# Patient Record
Sex: Female | Born: 2000 | Race: White | Hispanic: No | Smoking: Never smoker
Health system: Southern US, Community
[De-identification: ages and names within clinical notes are randomized; demographics above are authoritative.]

## PROBLEM LIST (undated history)

## (undated) ENCOUNTER — Inpatient Hospital Stay: Payer: Self-pay

## (undated) DIAGNOSIS — R569 Unspecified convulsions: Secondary | ICD-10-CM

## (undated) DIAGNOSIS — J45909 Unspecified asthma, uncomplicated: Secondary | ICD-10-CM

## (undated) HISTORY — PX: TONSILLECTOMY: SUR1361

## (undated) HISTORY — PX: ADENOIDECTOMY: SUR15

---
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
## 2020-10-15 ENCOUNTER — Ambulatory Visit
Admission: RE | Admit: 2020-10-15 | Discharge: 2020-10-15 | Disposition: A | Discharge status: Home / Self Care | Payer: Medicaid Other | Attending: Medical Oncology | Admitting: Medical Oncology

## 2020-10-15 ENCOUNTER — Other Ambulatory Visit: Payer: Self-pay | Admitting: Medical Oncology

## 2020-10-15 ENCOUNTER — Ambulatory Visit
Admission: RE | Admit: 2020-10-15 | Discharge: 2020-10-15 | Disposition: A | Discharge status: Home / Self Care | Payer: Medicaid Other | Source: Ambulatory Visit | Attending: Medical Oncology | Admitting: Medical Oncology

## 2020-10-15 ENCOUNTER — Other Ambulatory Visit: Payer: Self-pay

## 2020-10-15 DIAGNOSIS — G5701 Lesion of sciatic nerve, right lower limb: Secondary | ICD-10-CM | POA: Diagnosis present

## 2021-01-06 ENCOUNTER — Emergency Department
Admission: EM | Admit: 2021-01-06 | Discharge: 2021-01-06 | Disposition: A | Discharge status: Home / Self Care | Payer: Medicaid Other | Attending: Emergency Medicine | Admitting: Emergency Medicine

## 2021-01-06 ENCOUNTER — Other Ambulatory Visit: Payer: Self-pay

## 2021-01-06 ENCOUNTER — Emergency Department: Payer: Medicaid Other

## 2021-01-06 DIAGNOSIS — R441 Visual hallucinations: Secondary | ICD-10-CM

## 2021-01-06 DIAGNOSIS — F410 Panic disorder [episodic paroxysmal anxiety] without agoraphobia: Secondary | ICD-10-CM

## 2021-01-06 DIAGNOSIS — R45851 Suicidal ideations: Secondary | ICD-10-CM | POA: Insufficient documentation

## 2021-01-06 DIAGNOSIS — F419 Anxiety disorder, unspecified: Secondary | ICD-10-CM | POA: Diagnosis not present

## 2021-01-06 DIAGNOSIS — J45909 Unspecified asthma, uncomplicated: Secondary | ICD-10-CM | POA: Diagnosis not present

## 2021-01-06 DIAGNOSIS — Z8669 Personal history of other diseases of the nervous system and sense organs: Secondary | ICD-10-CM

## 2021-01-06 DIAGNOSIS — Z7722 Contact with and (suspected) exposure to environmental tobacco smoke (acute) (chronic): Secondary | ICD-10-CM | POA: Diagnosis not present

## 2021-01-06 DIAGNOSIS — R519 Headache, unspecified: Secondary | ICD-10-CM

## 2021-01-06 DIAGNOSIS — R443 Hallucinations, unspecified: Secondary | ICD-10-CM

## 2021-01-06 DIAGNOSIS — G8929 Other chronic pain: Secondary | ICD-10-CM

## 2021-01-06 HISTORY — DX: Unspecified convulsions: R56.9

## 2021-01-06 LAB — URINE DRUG SCREEN, QUALITATIVE (ARMC ONLY)
Amphetamines, Ur Screen: NOT DETECTED
Barbiturates, Ur Screen: NOT DETECTED
Benzodiazepine, Ur Scrn: NOT DETECTED
Cannabinoid 50 Ng, Ur ~~LOC~~: NOT DETECTED
Cocaine Metabolite,Ur ~~LOC~~: NOT DETECTED
MDMA (Ecstasy)Ur Screen: NOT DETECTED
Methadone Scn, Ur: NOT DETECTED
Opiate, Ur Screen: NOT DETECTED
Phencyclidine (PCP) Ur S: NOT DETECTED
Tricyclic, Ur Screen: NOT DETECTED

## 2021-01-06 LAB — COMPREHENSIVE METABOLIC PANEL
ALT: 17 U/L (ref 0–44)
AST: 18 U/L (ref 15–41)
Albumin: 4.6 g/dL (ref 3.5–5.0)
Alkaline Phosphatase: 54 U/L (ref 38–126)
Anion gap: 9 (ref 5–15)
BUN: 11 mg/dL (ref 6–20)
CO2: 21 mmol/L — ABNORMAL LOW (ref 22–32)
Calcium: 9.7 mg/dL (ref 8.9–10.3)
Chloride: 109 mmol/L (ref 98–111)
Creatinine, Ser: 0.78 mg/dL (ref 0.44–1.00)
GFR, Estimated: >60 mL/min (ref >60)
Glucose, Bld: 111 mg/dL — ABNORMAL HIGH (ref 70–99)
Potassium: 3.7 mmol/L (ref 3.5–5.1)
Sodium: 139 mmol/L (ref 135–145)
Total Bilirubin: 0.7 mg/dL (ref 0.3–1.2)
Total Protein: 7.8 g/dL (ref 6.5–8.1)

## 2021-01-06 LAB — CBC
HCT: 41.6 % (ref 36.0–46.0)
Hemoglobin: 13.9 g/dL (ref 12.0–15.0)
MCH: 29.6 pg (ref 26.0–34.0)
MCHC: 33.4 g/dL (ref 30.0–36.0)
MCV: 88.5 fL (ref 80.0–100.0)
Platelets: 321 10*3/uL (ref 150–400)
RBC: 4.7 MIL/uL (ref 3.87–5.11)
RDW: 12.8 % (ref 11.5–15.5)
WBC: 7.7 10*3/uL (ref 4.0–10.5)
nRBC: 0 % (ref 0.0–0.2)

## 2021-01-06 LAB — SALICYLATE LEVEL: Salicylate Lvl: <7 mg/dL — ABNORMAL LOW (ref 7.0–30.0)

## 2021-01-06 LAB — POC URINE PREG, ED: Preg Test, Ur: NEGATIVE

## 2021-01-06 LAB — ETHANOL: Alcohol, Ethyl (B): <10 mg/dL (ref <10)

## 2021-01-06 LAB — TSH: TSH: 0.561 u[IU]/mL (ref 0.350–4.500)

## 2021-01-06 LAB — ACETAMINOPHEN LEVEL: Acetaminophen (Tylenol), Serum: <10 ug/mL — ABNORMAL LOW (ref 10–30)

## 2021-01-06 NOTE — ED Notes (Signed)
TTS at bedside. 

## 2021-01-06 NOTE — ED Notes (Signed)
Pt transported to CT ?

## 2021-01-06 NOTE — ED Provider Notes (Signed)
[facility] Emergency Department Provider Note   ____________________________________________   Event Date/Time   First MD Initiated Contact with Patient 01/06/21 1553     (approximate)  I have reviewed the triage vital signs and the nursing notes.   HISTORY  Chief Complaint Hallucinations    HPI Allison [name] is a 20 y.o. female with past medical history of asthma, migraines, and seizures who presents to the ED for hallucinations.  Patient reports that for the past couple of weeks she has been seeing a dark figure off in the distance.  She states that no one else sees this figure and that it is very upsetting for her.  It makes her feel anxious and like she is going to have a panic attack, but it does not typically come on because she is feeling anxious.  She denies any auditory hallucinations and does not have any thoughts of harming herself or others.  She denies any alcohol or drug abuse.  She does report a history of anxiety and seizures in the past, was previously on medications for these but was stopped about a year ago for reasons that patient is unsure of.  She states she recently scheduled an appointment with her neurologist for March 1 to discuss restarting her medications.  She denies any fevers, cough, headache, chest pain, shortness of breath, dysuria, or hematuria.        Past Medical History:  Diagnosis Date  . Asthma   . Migraine   . Seizures [facility])     Patient Active Problem List   Diagnosis Date Noted  . Visual hallucination 01/06/2021  . Chronic headaches 01/06/2021  . 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 Panic attacks 01/06/2021    Past Surgical History:  Procedure Laterality Date  . ADENOIDECTOMY    . TONSILLECTOMY      Prior to Admission medications   Medication Sig Start Date End Date Taking? Authorizing Provider  acetaminophen-codeine (TYLENOL #3) 300-30 MG per tablet Take 1 tablet by mouth every 4 (four) hours as  needed. For pain.    [provider]  cephALEXin (KEFLEX) 500 MG capsule Take 1 capsule (500 mg total) by mouth 3 (three) times daily. 11/23/17   Minna Antis, MD  ondansetron (ZOFRAN ODT) 4 MG disintegrating tablet Take 1 tablet (4 mg total) by mouth every 8 (eight) hours as needed for nausea or vomiting. 11/23/17   Minna Antis, MD  polyethylene glycol powder (GLYCOLAX/MIRALAX) powder Take 17 g by mouth daily. 11/01/14   Saverio Danker, MD  triamcinolone cream (KENALOG) 0.1 % Apply 1 application topically 2 (two) times daily as needed. For eczema    [provider]    Allergies Lactose intolerance (gi)  History reviewed. No pertinent family history.  Social History Social History   Tobacco Use  . Smoking status: Passive Smoke Exposure - Never Smoker  . Smokeless tobacco: Never Used  Substance Use Topics  . Alcohol use: No  . Drug use: Never    Review of Systems  Constitutional: No fever/chills Eyes: No visual changes. ENT: No sore throat. Cardiovascular: Denies chest pain. Respiratory: Denies shortness of breath. Gastrointestinal: No abdominal pain.  No nausea, no vomiting.  No diarrhea.  No constipation. Genitourinary: Negative for dysuria. Musculoskeletal: Negative for back pain. Skin: Negative for rash. Neurological: Negative for headaches, focal weakness or numbness.  Positive for hallucinations.  ____________________________________________   PHYSICAL EXAM:  VITAL SIGNS: ED Triage Vitals  Enc Vitals Group  BP 01/06/21 1528 130/74     Pulse Rate 01/06/21 1528 98     Resp 01/06/21 1528 18     Temp 01/06/21 1528 98 F (36.7 C)     Temp src --      SpO2 01/06/21 1528 100 %     Weight 01/06/21 1529 150 lb (68 kg)     Height 01/06/21 1529 5' (1.524 m)     Head Circumference --      Peak Flow --      Pain Score 01/06/21 1528 4     Pain Loc --      Pain Edu? --      Excl. in GC? --     Constitutional: Alert and  oriented. Eyes: Conjunctivae are normal. Head: Atraumatic. Nose: No congestion/rhinnorhea. Mouth/Throat: Mucous membranes are moist. Neck: Normal ROM Cardiovascular: Normal rate, regular rhythm. Grossly normal heart sounds. Respiratory: Normal respiratory effort.  No retractions. Lungs CTAB. Gastrointestinal: Soft and nontender. No distention. Genitourinary: deferred Musculoskeletal: No lower extremity tenderness nor edema. Neurologic:  Normal speech and language. No gross focal neurologic deficits are appreciated. Skin:  Skin is warm, dry and intact. No rash noted. Psychiatric: Mood and affect are normal. Speech and behavior are normal.  ____________________________________________   LABS (all labs ordered are listed, but only abnormal results are displayed)  Labs Reviewed  COMPREHENSIVE METABOLIC PANEL - Abnormal; Notable for the following components:      Result Value   CO2 21 (*)    Glucose, Bld 111 (*)    All other components within normal limits  SALICYLATE LEVEL - Abnormal; Notable for the following components:   Salicylate Lvl <7.0 (*)    All other components within normal limits  ACETAMINOPHEN LEVEL - Abnormal; Notable for the following components:   Acetaminophen (Tylenol), Serum <10 (*)    All other components within normal limits  ETHANOL  CBC  URINE DRUG SCREEN, QUALITATIVE ([facility] ONLY)  TSH  POC URINE PREG, ED    PROCEDURES  Procedure(s) performed (including Critical Care):  Procedures   ____________________________________________   INITIAL IMPRESSION / ASSESSMENT AND PLAN / ED COURSE       20 year old female with past medical history of migraine, seizures, and asthma who presents to the ED for hallucinations of a dark figure increasing over the past couple of weeks.  Patient is very lucid at this time, does not appear psychotic and denies any thoughts of harming herself or others.  She was evaluated by psychiatry, who does not feel patient  represents any acute threat to herself or others.  There is no indication for psychiatric admission at this time.  Screening labs are unremarkable and we will add on TSH as well as CT head at the recommendation of psychiatry.  If remainder of work-up is unremarkable, patient would be appropriate for discharge home with neurology follow-up.  CT head reviewed by me, no obvious hemorrhage and negative for acute process per radiology.  TSH is within normal limits.  Patient is appropriate for discharge home with neurology and psychiatry follow-up, was counseled to return to the ED for new worsening symptoms.  Patient agrees with plan.      ____________________________________________   FINAL CLINICAL IMPRESSION(S) / ED DIAGNOSES  Final diagnoses:  Hallucinations     ED Discharge Orders    None       Note:  This document was prepared using Dragon voice recognition software and may include unintentional dictation errors.   Chesley Noon, MD 01/06/21  1805  
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
## 2021-01-06 NOTE — ED Notes (Signed)
Psychiatrist at bedside

## 2021-01-06 NOTE — ED Triage Notes (Signed)
Pt comes via POV from home with c/o hallucinations. Pt states this started a couple weeks ago. Pt states it has been causing her anxiety and her behavior.  Pt states it is a dark figure. Pt denies any voices. Pt denies any SI or HI.  Pt states she just told her neurologist today and they advised her to come here.  Pt denies any new medication. Pt denies any drugs or alcohol.

## 2021-01-06 NOTE — ED Notes (Signed)
VOL, psych consult complete, pend dispo

## 2021-01-06 NOTE — ED Notes (Signed)
Pt alert and oriented X 4, stable for discharge. RR even and unlabored, color WNL. Discussed discharge instructions and follow-up as directed. Discharge medications discussed if prescribed. Pt had opportunity to ask questions, and RN to provide patient/family eduction.  

## 2021-01-06 NOTE — ED Notes (Signed)
Lab called to add on TSH to previous lab work

## 2021-04-28 ENCOUNTER — Other Ambulatory Visit: Payer: Self-pay

## 2021-04-28 ENCOUNTER — Emergency Department
Admission: EM | Admit: 2021-04-28 | Discharge: 2021-04-28 | Disposition: A | Discharge status: Home / Self Care | Payer: No Typology Code available for payment source | Attending: Emergency Medicine | Admitting: Emergency Medicine

## 2021-04-28 ENCOUNTER — Emergency Department: Payer: No Typology Code available for payment source

## 2021-04-28 DIAGNOSIS — S39012A Strain of muscle, fascia and tendon of lower back, initial encounter: Secondary | ICD-10-CM | POA: Insufficient documentation

## 2021-04-28 DIAGNOSIS — Z7722 Contact with and (suspected) exposure to environmental tobacco smoke (acute) (chronic): Secondary | ICD-10-CM | POA: Diagnosis not present

## 2021-04-28 DIAGNOSIS — S161XXA Strain of muscle, fascia and tendon at neck level, initial encounter: Secondary | ICD-10-CM | POA: Insufficient documentation

## 2021-04-28 DIAGNOSIS — R519 Headache, unspecified: Secondary | ICD-10-CM | POA: Insufficient documentation

## 2021-04-28 DIAGNOSIS — Y92410 Unspecified street and highway as the place of occurrence of the external cause: Secondary | ICD-10-CM | POA: Diagnosis not present

## 2021-04-28 DIAGNOSIS — S199XXA Unspecified injury of neck, initial encounter: Secondary | ICD-10-CM | POA: Diagnosis present

## 2021-04-28 DIAGNOSIS — J45909 Unspecified asthma, uncomplicated: Secondary | ICD-10-CM | POA: Insufficient documentation

## 2021-04-28 LAB — POC URINE PREG, ED: Preg Test, Ur: NEGATIVE

## 2021-04-28 MED ORDER — METHOCARBAMOL 500 MG PO TABS: 500.0000 mg | ORAL_TABLET | Freq: Four times a day (QID) | ORAL | 0 refills | Status: AC

## 2021-04-28 MED ORDER — MELOXICAM 15 MG PO TABS: 15.0000 mg | ORAL_TABLET | Freq: Every day | ORAL | 0 refills | Status: AC

## 2021-04-28 MED ORDER — ACETAMINOPHEN 325 MG PO TABS
650.0000 mg | ORAL_TABLET | Freq: Once | ORAL | Status: AC
  Administered 2021-04-28: 650 mg via ORAL
  Filled 2021-04-28: qty 2

## 2021-04-28 MED ORDER — METHOCARBAMOL 500 MG PO TABS
750.0000 mg | ORAL_TABLET | Freq: Once | ORAL | Status: AC
  Administered 2021-04-28: 750 mg via ORAL
  Filled 2021-04-28: qty 2
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
## 2021-04-28 NOTE — Discharge Instructions (Addendum)
Please take medications as prescribed. You may also take Tylenol, up to 1000mg  4x daily as needed for pain. Return to the ER with any worsening, otherwise follow up with primary care.

## 2021-05-14 ENCOUNTER — Ambulatory Visit (LOCAL_COMMUNITY_HEALTH_CENTER): Payer: Medicaid Other

## 2021-05-14 ENCOUNTER — Other Ambulatory Visit: Payer: Self-pay

## 2021-05-14 VITALS — BP 101/67 | Ht 59.0 in | Wt 177.0 lb

## 2021-05-14 DIAGNOSIS — Z3202 Encounter for pregnancy test, result negative: Secondary | ICD-10-CM

## 2021-05-14 NOTE — Progress Notes (Signed)
Pt states she is not trying to get pregnant but if it happens she and her partner are okay with it; they are not trying to prevent pregnancy; she would like to become pregnant. Pt declines all forms of birth control and is currently taking PNV. Pt states she has had several positive home UPT and that she will continue to proceed with PNV and healthy lifestyle as if she is pregnant. Pt states she "was taken off some medicines" because she is pregnant; pt counseled to discuss today's results and any recommended medications with her providers; pt confirms that she will contact providers.

## 2021-05-27 LAB — PREGNANCY, URINE: Preg Test, Ur: NEGATIVE

## 2021-05-27 NOTE — Addendum Note (Signed)
Addended by: Tracey Harries on: 05/27/2021 02:42 PM   Modules accepted: Orders

## 2021-06-05 DIAGNOSIS — Z3401 Encounter for supervision of normal first pregnancy, first trimester: Secondary | ICD-10-CM | POA: Insufficient documentation

## 2021-06-28 ENCOUNTER — Emergency Department
Admission: EM | Admit: 2021-06-28 | Discharge: 2021-06-28 | Disposition: A | Discharge status: Home / Self Care | Payer: Medicaid Other | Attending: Emergency Medicine | Admitting: Emergency Medicine

## 2021-06-28 ENCOUNTER — Emergency Department: Payer: Medicaid Other

## 2021-06-28 ENCOUNTER — Other Ambulatory Visit: Payer: Self-pay

## 2021-06-28 DIAGNOSIS — O039 Complete or unspecified spontaneous abortion without complication: Secondary | ICD-10-CM | POA: Diagnosis not present

## 2021-06-28 DIAGNOSIS — Z7722 Contact with and (suspected) exposure to environmental tobacco smoke (acute) (chronic): Secondary | ICD-10-CM | POA: Diagnosis not present

## 2021-06-28 DIAGNOSIS — J45909 Unspecified asthma, uncomplicated: Secondary | ICD-10-CM | POA: Insufficient documentation

## 2021-06-28 DIAGNOSIS — O4690 Antepartum hemorrhage, unspecified, unspecified trimester: Secondary | ICD-10-CM

## 2021-06-28 DIAGNOSIS — M545 Low back pain, unspecified: Secondary | ICD-10-CM | POA: Insufficient documentation

## 2021-06-28 DIAGNOSIS — N939 Abnormal uterine and vaginal bleeding, unspecified: Secondary | ICD-10-CM | POA: Diagnosis not present

## 2021-06-28 DIAGNOSIS — Z3A10 10 weeks gestation of pregnancy: Secondary | ICD-10-CM | POA: Diagnosis not present

## 2021-06-28 LAB — BASIC METABOLIC PANEL
Anion gap: 9 (ref 5–15)
BUN: 8 mg/dL (ref 6–20)
CO2: 20 mmol/L — ABNORMAL LOW (ref 22–32)
Calcium: 9.4 mg/dL (ref 8.9–10.3)
Chloride: 105 mmol/L (ref 98–111)
Creatinine, Ser: 0.62 mg/dL (ref 0.44–1.00)
GFR, Estimated: >60 mL/min (ref >60)
Glucose, Bld: 78 mg/dL (ref 70–99)
Potassium: 4 mmol/L (ref 3.5–5.1)
Sodium: 134 mmol/L — ABNORMAL LOW (ref 135–145)

## 2021-06-28 LAB — CBC
HCT: 42.6 % (ref 36.0–46.0)
Hemoglobin: 14.8 g/dL (ref 12.0–15.0)
MCH: 30.3 pg (ref 26.0–34.0)
MCHC: 34.7 g/dL (ref 30.0–36.0)
MCV: 87.1 fL (ref 80.0–100.0)
Platelets: 290 10*3/uL (ref 150–400)
RBC: 4.89 MIL/uL (ref 3.87–5.11)
RDW: 12.4 % (ref 11.5–15.5)
WBC: 11.2 10*3/uL — ABNORMAL HIGH (ref 4.0–10.5)
nRBC: 0 % (ref 0.0–0.2)

## 2021-06-28 LAB — ABO/RH: ABO/RH(D): O POS

## 2021-06-28 LAB — HCG, QUANTITATIVE, PREGNANCY: hCG, Beta Chain, Quant, S: 190323 m[IU]/mL — ABNORMAL HIGH (ref <5)

## 2021-06-28 MED ORDER — SODIUM CHLORIDE 0.9 % IV BOLUS
1000.0000 mL | Freq: Once | INTRAVENOUS | Status: AC
  Administered 2021-06-28: 1000 mL via INTRAVENOUS

## 2021-06-28 MED ORDER — HYDROCODONE-ACETAMINOPHEN 5-325 MG PO TABS: 1.0000 | ORAL_TABLET | Freq: Four times a day (QID) | ORAL | 0 refills | Status: DC | PRN

## 2021-06-28 MED ORDER — HYDROCODONE-ACETAMINOPHEN 5-325 MG PO TABS: 1.0000 | ORAL_TABLET | Freq: Four times a day (QID) | ORAL | 0 refills | Status: AC | PRN

## 2021-06-28 MED ORDER — ACETAMINOPHEN 325 MG PO TABS
650.0000 mg | ORAL_TABLET | Freq: Once | ORAL | Status: AC
  Administered 2021-06-28: 650 mg via ORAL
  Filled 2021-06-28: qty 2

## 2021-06-28 MED ORDER — ONDANSETRON HCL 4 MG/2ML IJ SOLN
4.0000 mg | Freq: Once | INTRAMUSCULAR | Status: AC
  Administered 2021-06-28: 4 mg via INTRAVENOUS
  Filled 2021-06-28: qty 2

## 2021-06-28 NOTE — ED Triage Notes (Signed)
Pt comes pov with vaginal bleeding and cramping starting today. Pt is [redacted] weeks pregnant. First pregnancy.

## 2021-06-28 NOTE — ED Provider Notes (Signed)
[facility]-EMERGENCY DEPARTMENT  ____________________________________________  Time seen: Approximately 5:17 PM  I have reviewed the triage vital signs and the nursing notes.   HISTORY  Chief Complaint Vaginal Bleeding   Historian Patient     HPI Allison [name] is a 20 y.o. female G1, P0 presents to the emergency department with vaginal bleeding and cramping.  Patient is complaining of low back pain.  She denies dysuria or increased urinary frequency.  She denies pelvic pain.  No changes in vaginal discharge or dyspareunia.  Patient states that she has had nausea and vomiting at home.  No chest pain, chest tightness or shortness of breath.   Past Medical History:  Diagnosis Date   Asthma    Migraine    Seizures (HCC)      Immunizations up to date:  Yes.     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 Authorizing Provider  acetaminophen-codeine (TYLENOL #3) 300-30 MG per tablet Take 1 tablet by mouth every 4 (four) hours as needed. For pain. Patient not taking: Reported on 05/14/2021    [provider]  cephALEXin (KEFLEX) 500 MG capsule Take 1 capsule (500 mg total) by mouth 3 (three) times daily. Patient not taking: Reported on 05/14/2021 11/23/17   Minna Antis, MD  HYDROcodone-acetaminophen (NORCO) 5-325 MG tablet Take 1 tablet by mouth every 6 (six) hours as needed for up to 2 days. 06/28/21 06/30/21  Orvil Feil, PA-C  ondansetron (ZOFRAN ODT) 4 MG disintegrating tablet Take 1 tablet (4 mg total) by mouth every 8 (eight) hours as needed for nausea or  vomiting. Patient not taking: Reported on 05/14/2021 11/23/17   Minna Antis, MD  polyethylene glycol powder (GLYCOLAX/MIRALAX) powder Take 17 g by mouth daily. Patient not taking: Reported on 05/14/2021 11/01/14   Saverio Danker, MD  Prenatal Vit-Fe Fumarate-FA (PRENATAL VITAMIN PO) Take 1 tablet by mouth daily.    [provider]  triamcinolone cream (KENALOG) 0.1 % Apply 1 application topically 2 (two) times daily as needed. For eczema Patient not taking: Reported on 05/14/2021    [provider]    Allergies Lactose intolerance (gi)  History reviewed. No pertinent family history.  Social History Social History   Tobacco Use   Smoking status: Never    Passive exposure: Yes   Smokeless tobacco: Never  Vaping Use   Vaping Use: Never used  Substance Use Topics   Alcohol use: Never   Drug use: Never     Review of Systems  Constitutional: No fever/chills Eyes:  No discharge ENT: No upper respiratory complaints. Respiratory: no cough. No SOB/ use of accessory muscles to breath Gastrointestinal:   No nausea, no vomiting.  No diarrhea.  No constipation. Genitourinary: Patient has vaginal bleeding.  Musculoskeletal: Negative for musculoskeletal pain. Skin: Negative for rash, abrasions, lacerations, ecchymosis.    ____________________________________________   PHYSICAL EXAM:  VITAL SIGNS: ED Triage Vitals  Enc Vitals Group     BP 06/28/21 1610 116/77     Pulse Rate 06/28/21 1610 98  Resp 06/28/21 1610 18     Temp 06/28/21 1610 98.7 F (37.1 C)     Temp Source 06/28/21 1610 Oral     SpO2 06/28/21 1610 98 %     Weight 06/28/21 1607 165 lb (74.8 kg)     Height 06/28/21 1607 5\' 2"  (1.575 m)     Head Circumference --      Peak Flow --      Pain Score 06/28/21 1607 10     Pain Loc --      Pain Edu? --      Excl. in GC? --      Constitutional: Alert and oriented. Well appearing and in no acute distress. Eyes: Conjunctivae are normal.  PERRL. EOMI. Head: Atraumatic. ENT:      Nose: No congestion/rhinnorhea.      Mouth/Throat: Mucous membranes are moist.  Neck: No stridor.  No cervical spine tenderness to palpation. Cardiovascular: Normal rate, regular rhythm. Normal S1 and S2.  Good peripheral circulation. Respiratory: Normal respiratory effort without tachypnea or retractions. Lungs CTAB. Good air entry to the bases with no decreased or absent breath sounds Gastrointestinal: Bowel sounds x 4 quadrants. Soft and nontender to palpation. No guarding or rigidity. No distention. Musculoskeletal: Full range of motion to all extremities. No obvious deformities noted Neurologic:  Normal for age. No gross focal neurologic deficits are appreciated.  Skin:  Skin is warm, dry and intact. No rash noted. Psychiatric: Mood and affect are normal for age. Speech and behavior are normal.   ____________________________________________   LABS (all labs ordered are listed, but only abnormal results are displayed)  Labs Reviewed  CBC - Abnormal; Notable for the following components:      Result Value   WBC 11.2 (*)    All other components within normal limits  BASIC METABOLIC PANEL - Abnormal; Notable for the following components:   Sodium 134 (*)    CO2 20 (*)    All other components within normal limits  HCG, QUANTITATIVE, PREGNANCY - Abnormal; Notable for the following components:   hCG, Beta Chain, Quant, S 190,323 (*)    All other components within normal limits  URINALYSIS, COMPLETE (UACMP) WITH MICROSCOPIC  POC URINE PREG, ED  ABO/RH   ____________________________________________  EKG   ____________________________________________  RADIOLOGY  06/30/21 OB Comp Less 14 Wks  Result Date: 06/28/2021 CLINICAL DATA:  Vaginal bleeding.  Cramping. EXAM: OBSTETRIC <14 WK ULTRASOUND TECHNIQUE: Transabdominal ultrasound was performed for evaluation of the gestation as well as the maternal uterus and adnexal regions. COMPARISON:   None. FINDINGS: Intrauterine gestational sac: There is a gestational sac in the cervix. Yolk sac:  Not Visualized. Embryo:  There is a fetus identified in the cervix. Cardiac Activity: Not Visualized. MSD:    mm    w     d CRL:   32.7 mm   10 w 1 d                  06/30/2021 EDC: Subchorionic hemorrhage:  None visualized. Maternal uterus/adnexae: The ovaries are unremarkable. IMPRESSION: The gestational sac containing a fetus is seen in the cervix consistent with spontaneous abortion in progress. Thickened heterogeneous endometrium is identified consistent with history. Recommend close follow-up and follow-up imaging as clinically warranted. Electronically Signed   By: [place] III M.D.   On: 06/28/2021 18:21    ____________________________________________    PROCEDURES  Procedure(s) performed:     Procedures     Medications  sodium chloride  0.9 % bolus 1,000 mL (0 mLs Intravenous Stopped 06/28/21 1930)  ondansetron (ZOFRAN) injection 4 mg (4 mg Intravenous Given 06/28/21 1733)  acetaminophen (TYLENOL) tablet 650 mg (650 mg Oral Given 06/28/21 1746)     ____________________________________________   INITIAL IMPRESSION / ASSESSMENT AND PLAN / ED COURSE  Pertinent labs & imaging results that were available during my care of the patient were reviewed by me and considered in my medical decision making (see chart for details).      Assessment and plan:  Vaginal bleeding 20 year old female presents to the emergency department with vaginal bleeding in pregnancy.  Vital signs were reassuring at triage.  On physical exam, patient was alert, active and nontoxic-appearing.  H&H were reassuring.  Patient was O+.  Beta-hCG was appropriately elevated.  Dedicated OB ultrasound shows findings concerning for spontaneous abortion.  Patient passed large quantity of products of conception while waiting in the emergency department.  She has a follow-up appointment with her OB/GYN on Thursday.  I  recommended keeping appointment.  Patient was discharged with a short course of Norco for pain.  Return precautions were given to return with new or worsening symptoms.    ____________________________________________  FINAL CLINICAL IMPRESSION(S) / ED DIAGNOSES  Final diagnoses:  Vaginal bleeding in pregnancy  Spontaneous abortion      NEW MEDICATIONS STARTED DURING THIS VISIT:  ED Discharge Orders          Ordered    HYDROcodone-acetaminophen (NORCO) 5-325 MG tablet  Every 6 hours PRN,   Status:  Discontinued        06/28/21 1925    HYDROcodone-acetaminophen (NORCO) 5-325 MG tablet  Every 6 hours PRN        06/28/21 1953                This chart was dictated using voice recognition software/Dragon. Despite best efforts to proofread, errors can occur which can change the meaning. Any change was purely unintentional.     Orvil Feil, PA-C 06/28/21 2113    Gilles Chiquito, MD 06/28/21 2253

## 2021-06-28 NOTE — Discharge Instructions (Addendum)
Keep appointment with obgyn on Thursday.  You can take Norco for pain.

## 2021-07-25 ENCOUNTER — Emergency Department: Payer: Medicaid Other | Admitting: Registered Nurse

## 2021-07-25 ENCOUNTER — Emergency Department: Payer: Medicaid Other

## 2021-07-25 ENCOUNTER — Encounter: Payer: Self-pay | Admitting: Emergency Medicine

## 2021-07-25 ENCOUNTER — Other Ambulatory Visit: Payer: Self-pay

## 2021-07-25 ENCOUNTER — Encounter
Admission: EM | Disposition: A | Discharge status: Home / Self Care | Payer: Self-pay | Source: Home / Self Care | Attending: Emergency Medicine

## 2021-07-25 ENCOUNTER — Ambulatory Visit
Admission: EM | Admit: 2021-07-25 | Discharge: 2021-07-25 | Disposition: A | Discharge status: Home / Self Care | Payer: Medicaid Other | Attending: Emergency Medicine | Admitting: Emergency Medicine

## 2021-07-25 DIAGNOSIS — O034 Incomplete spontaneous abortion without complication: Secondary | ICD-10-CM

## 2021-07-25 DIAGNOSIS — Z20822 Contact with and (suspected) exposure to covid-19: Secondary | ICD-10-CM | POA: Diagnosis not present

## 2021-07-25 DIAGNOSIS — Z9889 Other specified postprocedural states: Secondary | ICD-10-CM

## 2021-07-25 DIAGNOSIS — Z7722 Contact with and (suspected) exposure to environmental tobacco smoke (acute) (chronic): Secondary | ICD-10-CM | POA: Insufficient documentation

## 2021-07-25 DIAGNOSIS — Z8669 Personal history of other diseases of the nervous system and sense organs: Secondary | ICD-10-CM | POA: Diagnosis not present

## 2021-07-25 DIAGNOSIS — N939 Abnormal uterine and vaginal bleeding, unspecified: Secondary | ICD-10-CM

## 2021-07-25 HISTORY — PX: DILATION AND CURETTAGE OF UTERUS: SHX78

## 2021-07-25 LAB — CBC WITH DIFFERENTIAL/PLATELET
Abs Immature Granulocytes: 0.03 10*3/uL (ref 0.00–0.07)
Basophils Absolute: 0 10*3/uL (ref 0.0–0.1)
Basophils Relative: 0 %
Eosinophils Absolute: 0.1 10*3/uL (ref 0.0–0.5)
Eosinophils Relative: 1 %
HCT: 35.9 % — ABNORMAL LOW (ref 36.0–46.0)
Hemoglobin: 12.3 g/dL (ref 12.0–15.0)
Immature Granulocytes: 0 %
Lymphocytes Relative: 29 %
Lymphs Abs: 2.7 10*3/uL (ref 0.7–4.0)
MCH: 31.4 pg (ref 26.0–34.0)
MCHC: 34.3 g/dL (ref 30.0–36.0)
MCV: 91.6 fL (ref 80.0–100.0)
Monocytes Absolute: 0.5 10*3/uL (ref 0.1–1.0)
Monocytes Relative: 6 %
Neutro Abs: 5.7 10*3/uL (ref 1.7–7.7)
Neutrophils Relative %: 64 %
Platelets: 333 10*3/uL (ref 150–400)
RBC: 3.92 MIL/uL (ref 3.87–5.11)
RDW: 13.2 % (ref 11.5–15.5)
WBC: 9.1 10*3/uL (ref 4.0–10.5)
nRBC: 0 % (ref 0.0–0.2)

## 2021-07-25 LAB — BASIC METABOLIC PANEL
Anion gap: 6 (ref 5–15)
BUN: 8 mg/dL (ref 6–20)
CO2: 23 mmol/L (ref 22–32)
Calcium: 8.7 mg/dL — ABNORMAL LOW (ref 8.9–10.3)
Chloride: 109 mmol/L (ref 98–111)
Creatinine, Ser: 0.68 mg/dL (ref 0.44–1.00)
GFR, Estimated: >60 mL/min (ref >60)
Glucose, Bld: 111 mg/dL — ABNORMAL HIGH (ref 70–99)
Potassium: 3.4 mmol/L — ABNORMAL LOW (ref 3.5–5.1)
Sodium: 138 mmol/L (ref 135–145)

## 2021-07-25 LAB — RESP PANEL BY RT-PCR (FLU A&B, COVID) ARPGX2
Influenza A by PCR: NEGATIVE
Influenza B by PCR: NEGATIVE
SARS Coronavirus 2 by RT PCR: NEGATIVE

## 2021-07-25 LAB — HCG, QUANTITATIVE, PREGNANCY: hCG, Beta Chain, Quant, S: 51 m[IU]/mL — ABNORMAL HIGH (ref <5)

## 2021-07-25 LAB — TYPE AND SCREEN
ABO/RH(D): O POS
Antibody Screen: NEGATIVE

## 2021-07-25 SURGERY — DILATION AND CURETTAGE
Anesthesia: General | Site: Vagina

## 2021-07-25 MED ORDER — KETOROLAC TROMETHAMINE 30 MG/ML IJ SOLN: 30.0000 mg | Freq: Once | INTRAMUSCULAR | Status: DC

## 2021-07-25 MED ORDER — 0.9 % SODIUM CHLORIDE (POUR BTL) OPTIME
TOPICAL | Status: DC | PRN
  Administered 2021-07-25: 10 mL

## 2021-07-25 MED ORDER — LIDOCAINE HCL (PF) 1 % IJ SOLN
INTRAMUSCULAR | Status: AC
  Filled 2021-07-25: qty 30

## 2021-07-25 MED ORDER — DEXAMETHASONE SODIUM PHOSPHATE 10 MG/ML IJ SOLN
INTRAMUSCULAR | Status: DC | PRN
Start: 2021-07-25 — End: 2021-07-25
  Administered 2021-07-25: 10 mg via INTRAVENOUS

## 2021-07-25 MED ORDER — FENTANYL CITRATE (PF) 100 MCG/2ML IJ SOLN
INTRAMUSCULAR | Status: AC
  Filled 2021-07-25: qty 2

## 2021-07-25 MED ORDER — LACTATED RINGERS IV SOLN: INTRAVENOUS | Status: DC

## 2021-07-25 MED ORDER — ACETAMINOPHEN 10 MG/ML IV SOLN
INTRAVENOUS | Status: AC
  Filled 2021-07-25: qty 100

## 2021-07-25 MED ORDER — PROPOFOL 10 MG/ML IV BOLUS
INTRAVENOUS | Status: DC | PRN
  Administered 2021-07-25: 160 mg via INTRAVENOUS

## 2021-07-25 MED ORDER — PHENYLEPHRINE HCL (PRESSORS) 10 MG/ML IV SOLN
INTRAVENOUS | Status: DC | PRN
  Administered 2021-07-25: 50 ug via INTRAVENOUS

## 2021-07-25 MED ORDER — FENTANYL CITRATE (PF) 100 MCG/2ML IJ SOLN
INTRAMUSCULAR | Status: DC | PRN
  Administered 2021-07-25 (×4): 25 ug via INTRAVENOUS

## 2021-07-25 MED ORDER — ONDANSETRON HCL 4 MG/2ML IJ SOLN
INTRAMUSCULAR | Status: DC | PRN
  Administered 2021-07-25: 4 mg via INTRAVENOUS

## 2021-07-25 MED ORDER — FENTANYL CITRATE (PF) 100 MCG/2ML IJ SOLN
INTRAMUSCULAR | Status: AC
  Administered 2021-07-25: 25 ug via INTRAVENOUS
  Filled 2021-07-25: qty 2

## 2021-07-25 MED ORDER — DEXAMETHASONE SODIUM PHOSPHATE 10 MG/ML IJ SOLN
INTRAMUSCULAR | Status: AC
  Filled 2021-07-25: qty 1

## 2021-07-25 MED ORDER — SODIUM CHLORIDE 0.9 % IV SOLN: INTRAVENOUS | Status: DC

## 2021-07-25 MED ORDER — MIDAZOLAM HCL 2 MG/2ML IJ SOLN
INTRAMUSCULAR | Status: AC
  Filled 2021-07-25: qty 2

## 2021-07-25 MED ORDER — PROPOFOL 10 MG/ML IV BOLUS
INTRAVENOUS | Status: AC
  Filled 2021-07-25: qty 20

## 2021-07-25 MED ORDER — IBUPROFEN 800 MG PO TABS: 800.0000 mg | ORAL_TABLET | Freq: Three times a day (TID) | ORAL | 0 refills | Status: DC | PRN

## 2021-07-25 MED ORDER — ONDANSETRON HCL 4 MG/2ML IJ SOLN: 4.0000 mg | Freq: Once | INTRAMUSCULAR | Status: DC | PRN

## 2021-07-25 MED ORDER — ONDANSETRON HCL 4 MG/2ML IJ SOLN
INTRAMUSCULAR | Status: AC
  Filled 2021-07-25: qty 2

## 2021-07-25 MED ORDER — LIDOCAINE HCL (PF) 2 % IJ SOLN
INTRAMUSCULAR | Status: AC
  Filled 2021-07-25: qty 5

## 2021-07-25 MED ORDER — ACETAMINOPHEN 10 MG/ML IV SOLN
INTRAVENOUS | Status: DC | PRN
  Administered 2021-07-25: 1000 mg via INTRAVENOUS

## 2021-07-25 MED ORDER — EPHEDRINE 5 MG/ML INJ
INTRAVENOUS | Status: AC
  Filled 2021-07-25: qty 5

## 2021-07-25 MED ORDER — MIDAZOLAM HCL 2 MG/2ML IJ SOLN
INTRAMUSCULAR | Status: DC | PRN
  Administered 2021-07-25: 2 mg via INTRAVENOUS

## 2021-07-25 MED ORDER — DOXYCYCLINE HYCLATE 100 MG IV SOLR
200.0000 mg | INTRAVENOUS | Status: AC
Start: 2021-07-25 — End: 2021-07-25
  Administered 2021-07-25: 200 mg via INTRAVENOUS
  Filled 2021-07-25: qty 200

## 2021-07-25 MED ORDER — FENTANYL CITRATE (PF) 100 MCG/2ML IJ SOLN
25.0000 ug | INTRAMUSCULAR | Status: DC | PRN
  Administered 2021-07-25 (×3): 25 ug via INTRAVENOUS

## 2021-07-25 MED ORDER — SODIUM CHLORIDE FLUSH 0.9 % IV SOLN
INTRAVENOUS | Status: AC
  Filled 2021-07-25: qty 10

## 2021-07-25 MED ORDER — LIDOCAINE HCL (CARDIAC) PF 100 MG/5ML IV SOSY
PREFILLED_SYRINGE | INTRAVENOUS | Status: DC | PRN
  Administered 2021-07-25: 60 mg via INTRAVENOUS

## 2021-07-25 SURGICAL SUPPLY — 28 items
CATH ROBINSON RED A/P 16FR (CATHETERS) ×3 IMPLANT
DRSG TELFA 3X8 NADH (GAUZE/BANDAGES/DRESSINGS) ×3 IMPLANT
FILTER UTR ASPR SPEC (MISCELLANEOUS) ×2 IMPLANT
FLTR UTR ASPR SPEC (MISCELLANEOUS) ×3
GAUZE 4X4 16PLY ~~LOC~~+RFID DBL (SPONGE) ×6 IMPLANT
GLOVE SURG ENC MOIS LTX SZ6.5 (GLOVE) ×3 IMPLANT
GLOVE SURG UNDER LTX SZ7 (GLOVE) ×3 IMPLANT
GOWN STRL REUS W/ TWL LRG LVL3 (GOWN DISPOSABLE) ×2 IMPLANT
GOWN STRL REUS W/TWL LRG LVL3 (GOWN DISPOSABLE) ×3
KIT BERKELEY 1ST TRIMESTER 3/8 (MISCELLANEOUS) ×3 IMPLANT
KIT TURNOVER KIT A (KITS) ×3 IMPLANT
MANIFOLD NEPTUNE II (INSTRUMENTS) ×3 IMPLANT
NDL SPNL 22GX5 LNG QUINC BK (NEEDLE) ×1 IMPLANT
NEEDLE HYPO 25X1 1.5 SAFETY (NEEDLE) ×3 IMPLANT
NEEDLE SPNL 22GX5 LNG QUINC BK (NEEDLE) ×3 IMPLANT
PACK DNC HYST (MISCELLANEOUS) ×3 IMPLANT
PAD DRESSING TELFA 3X8 NADH (GAUZE/BANDAGES/DRESSINGS) ×1 IMPLANT
PAD OB MATERNITY 4.3X12.25 (PERSONAL CARE ITEMS) ×3 IMPLANT
PAD PREP 24X41 OB/GYN DISP (PERSONAL CARE ITEMS) ×3 IMPLANT
SCRUB EXIDINE 4% CHG 4OZ (MISCELLANEOUS) ×3 IMPLANT
SET BERKELEY SUCTION TUBING (SUCTIONS) ×3 IMPLANT
SOL PREP PVP 2OZ (MISCELLANEOUS) ×3
SOLUTION PREP PVP 2OZ (MISCELLANEOUS) ×2 IMPLANT
SYR 10ML LL (SYRINGE) ×3 IMPLANT
VACURETTE 10 RIGID CVD (CANNULA) IMPLANT
VACURETTE 12 RIGID CVD (CANNULA) IMPLANT
VACURETTE 8 RIGID CVD (CANNULA) IMPLANT
WATER STERILE IRR 500ML POUR (IV SOLUTION) ×3 IMPLANT

## 2021-07-25 NOTE — Op Note (Signed)
Procedure(s): DILATATION AND CURETTAGE Procedure Note  Allison [name] female 20 y.o. 07/25/2021  Indications: The patient is a 20 y.o. G15P0000 female with suspected retained products of conception  s/p spontaneous abortion ~ 3.5 weeks ago. She has been having intermittent episodes of bleeding since that time. Was treated with Methergine which helped, however after stopping medication, bleeding resumed and became heavy.   Pre-operative Diagnosis: Retained products of conception s/p spontaneous abortion, heavy vaginal bleeding  Post-operative Diagnosis: Same  Surgeon: Hildred Laser, MD  Assistants:  None  Anesthesia: General endotracheal anesthesia  Findings: The uterus was sounded to 8 cm Small tissue fragments consistent with products of conception was removed and sent to Pathology.  Procedure Details: The patient was seen in the Holding Room. The risks, benefits, complications, treatment options, and expected outcomes were discussed with the patient.  The patient concurred with the proposed plan, giving informed consent.  The site of surgery properly noted/marked. The patient was taken to the Operating Room, identified as Allison [name] and the procedure verified as Procedure(s) (LRB): DILATATION AND CURETTAGE (N/A). A Time Out was held and the above information confirmed.  The patient was brought to the operating room where she was placed into the supine position.  General LMA anesthesia was induced without incident. She was placed in the dorsal lithotomy position using candy cane stirrups, and was examined; A Betadine prep and drape was performed in sterile fashion.   A  Red Robinson catheter was used to drain the bladder of urine. A weighted speculum was placed into  the vagina and a single tooth tenaculum was applied to the anterior lip of the cervix. The cervix was noted to be approximately 1 cm dilated with small amount of bleeding and small clot in vaginal vault.  A sharp curettage  was then performed using a smooth curette which was inserted into the uterus to complete emptying of the uterus. Several small fragments of products of conception were retrieved. Minimal bleeding was encountered. The tenaculum was removed along with all instruments  from the  vagina.   The patient was awakened, mobilized and taken to the recovery room in satisfactory condition.  The procedure was well-tolerated.  The patient will be discharged to home as per PACU criteria.  Routine postoperative instructions were given along with a prescription for analgesics.  She will follow up in the clinic in 1-2 weeks for postoperative evaluation.   Estimated Blood Loss:  20 ml      Drains: straight catheterization prior to procedure with 75 ml of clear urine         Total IV Fluids:  350 ml  Specimens: Products of conception         Implants: None         Complications:  None; patient tolerated the procedure well.         Disposition: PACU - hemodynamically stable.         Condition: stable   Hildred Laser, MD [facility]

## 2021-07-25 NOTE — ED Notes (Signed)
Report given OR RN

## 2021-07-25 NOTE — ED Provider Notes (Signed)
[facility] Emergency Department Provider Note  ____________________________________________   Event Date/Time   First MD Initiated Contact with Patient 07/25/21 0118     (approximate)  I have reviewed the triage vital signs and the nursing notes.   HISTORY  Chief Complaint Vaginal Bleeding    HPI Allison [name] is a 20 y.o. female G3P0 with history of asthma, migraines, seizures who presents to the emergency department with complaints of vaginal bleeding.  She states vaginal bleeding started on 06/28/2021.  She was seen in the emergency department at that time and found to have an ultrasound consistent with spontaneous abortion.  She was approximately 10 weeks, 1 day by US not consistent with LMP of 04/09/21.  Seen by Dr. Feliberto GottronSchermerhorn with OB/GYN on 07/10/2021.  hCG was appropriately declining and ultrasound that time showed no gestational sac.  [facility] and opted for medical management with oral Methergine 0.2 mg every 6 hours for 2 days.  States that her bleeding was slowing down and stopped for about 12 hours but then has started to pick back up and now today she has had increased bleeding and is passing clots.  States she has soaked through 4-5 pads in the past hour.  She denies feeling lightheaded, having chest pain or shortness of breath.  She is not on blood thinners.  Is having lower abdominal cramping.  No abnormal discharge, odor, dysuria, fevers, chills, nausea, vomiting or diarrhea.  Patient's blood type is O positive.  Patient reports that she has not been sexually active since she began bleeding in August.        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 Authorizing Provider  acetaminophen-codeine (TYLENOL #3) 300-30 MG per tablet Take 1 tablet by mouth every 4 (four) hours as needed. For pain. Patient not taking: Reported on 05/14/2021    [provider]  cephALEXin (KEFLEX) 500 MG capsule Take 1 capsule (500 mg total) by mouth 3 (three) times daily. Patient not taking: Reported on 05/14/2021 11/23/17   Minna AntisPaduchowski, Kevin, MD  ondansetron (ZOFRAN ODT) 4 MG disintegrating tablet Take 1 tablet (4 mg total) by mouth every 8 (eight) hours as needed for nausea or vomiting. Patient not taking: Reported on 05/14/2021 11/23/17   Minna AntisPaduchowski, Kevin, MD  polyethylene glycol powder (GLYCOLAX/MIRALAX) powder Take 17 g by mouth daily. Patient not taking: Reported on 05/14/2021 11/01/14   Saverio DankerStephens, Sarah E, MD  Prenatal Vit-Fe Fumarate-FA (PRENATAL VITAMIN PO) Take 1 tablet by mouth daily.    [provider]  triamcinolone cream (KENALOG) 0.1 % Apply 1 application topically 2 (two) times daily as needed. For eczema Patient not taking: Reported on 05/14/2021    [provider]    Allergies Lactose and Lactose intolerance (gi)  No family history on file.  Social History Social History   Tobacco Use   Smoking status: Never    Passive exposure: Yes   Smokeless tobacco: Never  Vaping Use   Vaping Use: Never used  Substance Use Topics   Alcohol use: Never   Drug use: Never    Review of Systems Constitutional: No fever. Eyes: No visual changes. ENT: No sore throat. Cardiovascular: Denies chest pain. Respiratory: Denies shortness of breath. Gastrointestinal: No nausea, vomiting, diarrhea. Genitourinary: Negative for dysuria. Musculoskeletal: Negative for back pain. Skin: Negative for rash. Neurological: Negative for focal weakness or  numbness.  ____________________________________________   PHYSICAL EXAM:  VITAL SIGNS: ED Triage Vitals  Enc Vitals Group     BP 07/25/21 0049 118/79     Pulse Rate 07/25/21 0049 87     Resp 07/25/21 0049 18     Temp 07/25/21 0049 98.7 F (37.1 C)     Temp Source 07/25/21 0049 Oral     SpO2 07/25/21 0049 96 %     Weight 07/25/21 0049 165 lb (74.8 kg)     Height 07/25/21 0049 5\' 2"  (1.575 m)     Head Circumference --      Peak Flow --      Pain Score 07/25/21 0048 9     Pain Loc --      Pain Edu? --      Excl. in GC? --    CONSTITUTIONAL: Alert and oriented and responds appropriately to questions. Well-appearing; well-nourished HEAD: Normocephalic EYES: Conjunctivae clear, pupils appear equal, EOM appear intact ENT: normal nose; moist mucous membranes NECK: Supple, normal ROM CARD: RRR; S1 and S2 appreciated; no murmurs, no clicks, no rubs, no gallops RESP: Normal chest excursion without splinting or tachypnea; breath sounds clear and equal bilaterally; no wheezes, no rhonchi, no rales, no hypoxia or respiratory distress, speaking full sentences ABD/GI: Normal bowel sounds; non-distended; soft, non-tender, no rebound, no guarding, no peritoneal signs, no hepatosplenomegaly GU:  Normal external genitalia. No lesions, rashes noted.  Patient has a moderate to large amount of vaginal bleeding on exam coming from the cervical os with large clots.  No appreciable discharge.  Cervix is not appear friable.  Cervix is closed.  Chaperone present for exam. BACK: The back appears normal EXT: Normal ROM in all joints; no deformity noted, no edema; no cyanosis SKIN: Normal color for age and race; warm; no rash on exposed skin NEURO: Moves all extremities equally PSYCH: The patient's mood and manner are appropriate.  ____________________________________________   LABS (all labs ordered are listed, but only abnormal results are displayed)  Labs Reviewed  CBC WITH DIFFERENTIAL/PLATELET -  Abnormal; Notable for the following components:      Result Value   HCT 35.9 (*)    All other components within normal limits  BASIC METABOLIC PANEL - Abnormal; Notable for the following components:   Potassium 3.4 (*)    Glucose, Bld 111 (*)    Calcium 8.7 (*)    All other components within normal limits  HCG, QUANTITATIVE, PREGNANCY - Abnormal; Notable for the following components:   hCG, Beta Chain, Quant, S 51 (*)    All other components within normal limits  RESP PANEL BY RT-PCR (FLU A&B, COVID) ARPGX2  TYPE AND SCREEN   ____________________________________________  EKG   ____________________________________________  RADIOLOGY I, Harshitha Fretz, personally viewed and evaluated these images (plain radiographs) as part of my medical decision making, as well as reviewing the written report by the radiologist.  ED MD interpretation: 09/24/21 concerning for retained products of conception.  Official radiology report(s): US PELVIC COMPLETE W TRANSVAGINAL AND TORSION R/O  Result Date: 07/25/2021 CLINICAL DATA:  Initial evaluation for recent miscarriage, increased vaginal bleeding,  pain. EXAM: TRANSABDOMINAL AND TRANSVAGINAL ULTRASOUND OF PELVIS DOPPLER ULTRASOUND OF OVARIES TECHNIQUE: Both transabdominal and transvaginal ultrasound examinations of the pelvis were performed. Transabdominal technique was performed for global imaging of the pelvis including uterus, ovaries, adnexal regions, and pelvic cul-de-sac. It was necessary to proceed with endovaginal exam following the transabdominal exam to visualize the uterus, endometrium, and ovaries. Color and duplex Doppler ultrasound was utilized to evaluate blood flow to the ovaries. COMPARISON:  Prior ultrasound from 06/28/2021. FINDINGS: Uterus Measurements: 8.6 x 3.6 x 4.7 cm = volume: 76.0 mL. Uterus is anteverted. No discrete fibroid or mass. Endometrium Thickness: 13.5 mm. Markedly increased vascularity seen throughout the endometrial complex,  concerning for possible retained products of conception. Right ovary Measurements: 3.4 x 1.7 x 2.6 cm = volume: 7.8 mL. Normal appearance/no adnexal mass. Left ovary Measurements: 3.7 x 2.1 x 2.1 cm = volume: 8.6 mL. Normal appearance/no adnexal mass. Pulsed Doppler evaluation of both ovaries demonstrates normal low-resistance arterial and venous waveforms. Other findings Trace free fluid seen within the pelvis. IMPRESSION: 1. Endometrial stripe thickened up to 13.5 mm with markedly increased vascularity, concerning for retained products of conception. 2. Otherwise unremarkable and normal pelvic ultrasound. No evidence for torsion or other acute abnormality. Electronically Signed   By: Rise Mu M.D.   On: 07/25/2021 03:24    ____________________________________________   PROCEDURES  Procedure(s) performed (including Critical Care):  Procedures    ____________________________________________   INITIAL IMPRESSION / ASSESSMENT AND PLAN / ED COURSE  As part of my medical decision making, I reviewed the following data within the electronic MEDICAL RECORD NUMBER Nursing notes reviewed and incorporated, Labs reviewed , Old chart reviewed, A consult was requested and obtained from this/these consultant(s) OBGYN, ultrasound reviewed, and Notes from prior ED visits         Patient with increasing vaginal bleeding.  Recently had a spontaneous AB.  hCG was appropriate declining.  States that she has been bleeding since August 13 and that it only stopped for about 12 hours.  She started having heavier bleeding again tonight and passing clots and that is why she came to the ED.  Hemodynamically stable here.  We will repeat labs including hemoglobin, hCG.  She is on cardiac monitoring currently.  Will obtain transvaginal ultrasound to evaluate for retained products of conception.  Pelvic exam reveals a moderate amount of vaginal bleeding.  Patient is passing clots.  Abdominal exam benign.  Patient  declines STD screening today.  Will give Toradol for pain control here.  ED PROGRESS  Patient's hemoglobin is normal.  Her hCG is 51, down from 1510 on 07/10/2021.  Blood type is O+.  She does not need RhoGAM.  Ultrasound concerning for retained products of conception.  Will discuss with OB on-call for [facility] clinic for further recommendations.  3:35 AM  Spoke with Yvone Neu, CNM with [facility].  She recommends speaking with Dr. Valentino Saxon on call.    3:50 AM  Spoke with Dr. Valentino Saxon on call for OBGYN.  She states that we can offer patient Cytotec versus D&C although D&C does not need to be done emergently.  Spoke with patient who would prefer D&C at this point as she states she is "tired of bleeding" and being in pain.  Blood pressure currently 104/82.  Still has large amount of vaginal bleeding on exam but continues to be hemodynamically stable.  Updated Dr. Valentino Saxon with OB/GYN who will see patient.  We will keep patient n.p.o.  We will obtain  screening COVID swab.  Appreciate OB/GYN help.  I reviewed all nursing notes and pertinent previous records as available.  I have reviewed and interpreted any EKGs, lab and urine results, imaging (as available).  ____________________________________________   FINAL CLINICAL IMPRESSION(S) / ED DIAGNOSES  Final diagnoses:  Vaginal bleeding  Retained products of conception after miscarriage     ED Discharge Orders     None       *Please note:  JASMEET MANTON was evaluated in Emergency Department on 07/25/2021 for the symptoms described in the history of present illness. She was evaluated in the context of the global COVID-19 pandemic, which necessitated consideration that the patient might be at risk for infection with the SARS-CoV-2 virus that causes COVID-19. Institutional protocols and algorithms that pertain to the evaluation of patients at risk for COVID-19 are in a state of rapid change based on information released by regulatory bodies including  the CDC and federal and state organizations. These policies and algorithms were followed during the patient's care in the ED.  Some ED evaluations and interventions may be delayed as a result of limited staffing during and the pandemic.*   Note:  This document was prepared using Dragon voice recognition software and may include unintentional dictation errors.    Owynn Mosqueda, Layla Maw, DO 07/25/21 [phone]

## 2021-07-25 NOTE — Discharge Instructions (Addendum)
AMBULATORY SURGERY  DISCHARGE INSTRUCTIONS   The drugs that you were given will stay in your system until tomorrow so for the next 24 hours you should not:  Drive an automobile Make any legal decisions Drink any alcoholic beverage   You may resume regular meals tomorrow.  Today it is better to start with liquids and gradually work up to solid foods.  You may eat anything you prefer, but it is better to start with liquids, then soup and crackers, and gradually work up to solid foods.   Please notify your doctor immediately if you have any unusual bleeding, trouble breathing, redness and pain at the surgery site, drainage, fever, or pain not relieved by medication.    Additional Instructions:        Please contact your physician with any problems or Same Day Surgery at [phone], Monday through Friday 6 am to 4 pm, or [place] [facility] number at [phone].                  [facility] PERIOPERATIVE AREA [address] [place], [place]  [address] Phone:  [phone]   July 25, 2021  Patient: Allison [name]  Date of Birth: [birth year]  Date of Visit: July 25, 2021    To Whom It May Concern:  Allison [name] was seen and treated on July 25, 2021 and will be able to return to work on Monday July 28, 2021   .           If you have any questions or concerns, please don't hesitate to call.   Sincerely,   Dr. Hildred Laser [facility] [address] [place], [place] [address]

## 2021-07-25 NOTE — ED Triage Notes (Signed)
Pt arrived via POV with reports of miscarriage at 10 weeks mid-august. Pt reports heavy vaginal bleeding x 45 minutes tonight soaking through 4 pads since then. Pt reports she is passing large clots, Pt has followed up with OB [facility])  Pt c/o low abd cramping and low back pain.  Denies any fevers.   G-3 P-0

## 2021-07-25 NOTE — H&P (Signed)
OBSTETRICS AND GYNECOLOGY HISTORY AND PHYSICAL   Reason for Consult:Heavy vaginal bleeding and possible retained products of conception Referring Physician: Rochele Raring, DO  HPI:  Allison [name] is an 20 y.o. G1P0000 female who is approximately 3-1/2 weeks status post spontaneous abortion who presented to the ER overnight due to complaints of heavy vaginal bleeding.  She reports that since her miscarriage that started on 8/13 she has been having persistent bleeding.  She was seen approximately7-10 days later in the office at her [place] clinic (Dr. Jennell Corner) for follow-up where an ultrasound was performed which documented no IUP visualized and thickened endometrium and follow-up beta-hCG which was noted to trend down.  She was offered a D&C versus medical management with Methergine to manage her bleeding.  Patient notes that she chose to do medical management.  Reports that after use of the Methergine for approximately 2 days the bleeding had slowed to spotting.  However over the past 2 days she notes the bleeding began to pick up again, along with associated cramping.  Overnight the bleeding became significantly heavier to the point where she was changing 4-5 pads in an hour and so presented again to the emergency room.  Beta-hCG trend reviewed, decreased from 190, 323 on 8/13, now currently 51 today.  Pertinent Gynecological History: Patient's last menstrual period was 04/09/2021 (exact date). Blood transfusions: none Sexually transmitted diseases: no past history Previous GYN Procedures:  none   Last pap: No previous Pap history     OB History  Gravida Para Term Preterm AB Living  1 0 0 0 0 0  SAB IAB Ectopic Multiple Live Births  0 0 0 0 0    # Outcome Date GA Lbr Len/2nd Weight Sex Delivery Anes PTL Lv  1 Gravida             Past Medical History:  Diagnosis Date   Asthma    Migraine    Seizures (HCC)     Past Surgical History:  Procedure Laterality Date    ADENOIDECTOMY     TONSILLECTOMY      No family history on file.  Social History:  reports that she has never smoked. She has been exposed to tobacco smoke. She has never used smokeless tobacco. She reports that she does not drink alcohol and does not use drugs.  Allergies:  Allergies  Allergen Reactions   Lactose     Other reaction(s): Other (See Comments) Intolerance   Lactose Intolerance (Gi)     Medications: I have reviewed the patient's current medications. No current facility-administered medications on file prior to encounter.   Current Outpatient Medications on File Prior to Encounter  Medication Sig Dispense Refill   acetaminophen-codeine (TYLENOL #3) 300-30 MG per tablet Take 1 tablet by mouth every 4 (four) hours as needed. For pain. (Patient not taking: Reported on 05/14/2021)     cephALEXin (KEFLEX) 500 MG capsule Take 1 capsule (500 mg total) by mouth 3 (three) times daily. (Patient not taking: Reported on 05/14/2021) 21 capsule 0   ondansetron (ZOFRAN ODT) 4 MG disintegrating tablet Take 1 tablet (4 mg total) by mouth every 8 (eight) hours as needed for nausea or vomiting. (Patient not taking: Reported on 05/14/2021) 20 tablet 0   polyethylene glycol powder (GLYCOLAX/MIRALAX) powder Take 17 g by mouth daily. (Patient not taking: Reported on 05/14/2021) 850 g 0   Prenatal Vit-Fe Fumarate-FA (PRENATAL VITAMIN PO) Take 1 tablet by mouth daily.     triamcinolone cream (KENALOG) 0.1 %  Apply 1 application topically 2 (two) times daily as needed. For eczema (Patient not taking: Reported on 05/14/2021)       Review of Systems  Constitutional: Negative.   HENT: Negative.    Eyes: Negative.   Respiratory: Negative.    Cardiovascular: Negative.   Gastrointestinal: Negative.   Endocrine: Negative.   Genitourinary:  Positive for pelvic pain and vaginal bleeding. Negative for decreased urine volume, dysuria, flank pain, urgency, vaginal discharge and vaginal pain.  Musculoskeletal:  Negative.   Skin: Negative.   Neurological: Negative.   Hematological: Negative.   Psychiatric/Behavioral: Negative.     Blood pressure 101/62, pulse 70, temperature 98.7 F (37.1 C), temperature source Oral, resp. rate 16, height 5\' 2"  (1.575 m), weight 74.8 kg, last menstrual period 04/09/2021, SpO2 99 %, not currently breastfeeding.  Physical Exam Constitutional:      General: She is not in acute distress.    Appearance: Normal appearance. She is not ill-appearing.  HENT:     Head: Normocephalic and atraumatic.     Mouth/Throat:     Mouth: Mucous membranes are moist.     Pharynx: Oropharynx is clear.  Eyes:     Extraocular Movements: Extraocular movements intact.     Conjunctiva/sclera: Conjunctivae normal.     Pupils: Pupils are equal, round, and reactive to light.  Cardiovascular:     Rate and Rhythm: Normal rate and regular rhythm.  Pulmonary:     Effort: Pulmonary effort is normal.     Breath sounds: Normal breath sounds.  Abdominal:     General: Abdomen is flat. Bowel sounds are normal.     Palpations: Abdomen is soft.  Genitourinary:    Comments: Deferred to OR Musculoskeletal:        General: No swelling or tenderness. Normal range of motion.     Cervical back: Normal range of motion and neck supple.  Skin:    General: Skin is warm and dry.  Neurological:     General: No focal deficit present.     Mental Status: She is alert and oriented to person, place, and time.  Psychiatric:        Mood and Affect: Mood normal.        Behavior: Behavior normal.    Results for orders placed or performed during the hospital encounter of 07/25/21 (from the past 48 hour(s))  CBC with Differential/Platelet     Status: Abnormal   Collection Time: 07/25/21  1:00 AM  Result Value Ref Range   WBC 9.1 4.0 - 10.5 K/uL   RBC 3.92 3.87 - 5.11 MIL/uL   Hemoglobin 12.3 12.0 - 15.0 g/dL   HCT 09/24/21 (L) 46.2 - 70.3 %   MCV 91.6 80.0 - 100.0 fL   MCH 31.4 26.0 - 34.0 pg   MCHC 34.3  30.0 - 36.0 g/dL   RDW 50.0 93.8 - 18.2 %   Platelets 333 150 - 400 K/uL   nRBC 0.0 0.0 - 0.2 %   Neutrophils Relative % 64 %   Neutro Abs 5.7 1.7 - 7.7 K/uL   Lymphocytes Relative 29 %   Lymphs Abs 2.7 0.7 - 4.0 K/uL   Monocytes Relative 6 %   Monocytes Absolute 0.5 0.1 - 1.0 K/uL   Eosinophils Relative 1 %   Eosinophils Absolute 0.1 0.0 - 0.5 K/uL   Basophils Relative 0 %   Basophils Absolute 0.0 0.0 - 0.1 K/uL   Immature Granulocytes 0 %   Abs Immature Granulocytes 0.03 0.00 -  0.07 K/uL    Comment: Performed at [facility], [address] [place], [place] [address]  Basic metabolic panel     Status: Abnormal   Collection Time: 07/25/21  1:00 AM  Result Value Ref Range   Sodium 138 135 - 145 mmol/L   Potassium 3.4 (L) 3.5 - 5.1 mmol/L   Chloride 109 98 - 111 mmol/L   CO2 23 22 - 32 mmol/L   Glucose, Bld 111 (H) 70 - 99 mg/dL    Comment: Glucose reference range applies only to samples taken after fasting for at least 8 hours.   BUN 8 6 - 20 mg/dL   Creatinine, Ser 6.040.68 0.44 - 1.00 mg/dL   Calcium 8.7 (L) 8.9 - 10.3 mg/dL   GFR, Estimated >54>60 >09>60 mL/min    Comment: (NOTE) Calculated using the CKD-EPI Creatinine Equation (2021)    Anion gap 6 5 - 15    Comment: Performed at [facility], [address] [place], [place] [address]  hCG, quantitative, pregnancy     Status: Abnormal   Collection Time: 07/25/21  1:00 AM  Result Value Ref Range   hCG, Beta Chain, Quant, S 51 (H) <5 mIU/mL    Comment:          GEST. AGE      CONC.  (mIU/mL)   <=1 WEEK        5 - 50     2 WEEKS       50 - 500     3 WEEKS       100 - 10,000     4 WEEKS     1,000 - 30,000     5 WEEKS     3,500 - 115,000   6-8 WEEKS     12,000 - 270,000    12 WEEKS     15,000 - 220,000        FEMALE AND NON-PREGNANT FEMALE:     LESS THAN 5 mIU/mL Performed at [facility], [address] [place], [place] [address]   Type and screen [facility]     Status:  None   Collection Time: 07/25/21  1:00 AM  Result Value Ref Range   ABO/RH(D) O POS    Antibody Screen NEG    Sample Expiration      07/28/2021,2359 Performed at [facility], [address] [place], [place] [address]     US PELVIC COMPLETE W TRANSVAGINAL AND TORSION R/O  Result Date: 07/25/2021 CLINICAL DATA:  Initial evaluation for recent miscarriage, increased vaginal bleeding, pain. EXAM: TRANSABDOMINAL AND TRANSVAGINAL ULTRASOUND OF PELVIS DOPPLER ULTRASOUND OF OVARIES TECHNIQUE: Both transabdominal and transvaginal ultrasound examinations of the pelvis were performed. Transabdominal technique was performed for global imaging of the pelvis including uterus, ovaries, adnexal regions, and pelvic cul-de-sac. It was necessary to proceed with endovaginal exam following the transabdominal exam to visualize the uterus, endometrium, and ovaries. Color and duplex Doppler ultrasound was utilized to evaluate blood flow to the ovaries. COMPARISON:  Prior ultrasound from 06/28/2021. FINDINGS: Uterus Measurements: 8.6 x 3.6 x 4.7 cm = volume: 76.0 mL. Uterus is anteverted. No discrete fibroid or mass. Endometrium Thickness: 13.5 mm. Markedly increased vascularity seen throughout the endometrial complex, concerning for possible retained products of conception. Right ovary Measurements: 3.4 x 1.7 x 2.6 cm = volume: 7.8 mL. Normal appearance/no adnexal mass. Left ovary Measurements: 3.7 x 2.1 x 2.1 cm = volume: 8.6 mL. Normal appearance/no adnexal mass. Pulsed  Doppler evaluation of both ovaries demonstrates normal low-resistance arterial and venous waveforms. Other findings Trace free fluid seen within the pelvis. IMPRESSION: 1. Endometrial stripe thickened up to 13.5 mm with markedly increased vascularity, concerning for retained products of conception. 2. Otherwise unremarkable and normal pelvic ultrasound. No evidence for torsion or other acute abnormality. Electronically Signed   By: Rise Mu M.D.   On: 07/25/2021 03:24    Assessment/Plan: Retained products of conception   - Ultrasound concerning for retained products of conception, patient's uterine lining is still thickened with increased vascularity.  Is consistent with patient's presentation as she is approximately 3 and half weeks status post spontaneous abortion at 10 weeks of gestation and is still currently experiencing episodes of heavy bleeding.  I discussed her options of management at this time which included again medical management (however this time with Cytotec) versus D&C.  Patient elects for surgical management at this time. Discussed management of missed abortion: expectant management vs misoprostol vs D&E.  Risks and benefits of all modalities discussed; all questions answered.  Patient opted for expectant management for now.  She was told to call if she changes her mind, or if there is no passage of tissue in 4 weeks or develops any symptoms of infection.  Bleeding precautions reviewed; she was told to call clinic or come to [facility] for any concerns.  Risks of surgery including bleeding, infection, injury to surrounding organs, need for additional procedures, possibility of intrauterine scarring which may impair future fertility, risk of retained products which may require further management and other postoperative/anesthesia complications will be explained to patient.  Patient has been n.p.o. since she has been in the emergency room, will remain n.p.o. at this time.  2 OR when ready.  Preoperative orders placed.   2.   Rh+ status -Patient does not require the use of RhoGAM for this pregnancy episode.   Hildred Laser, MD [facility] 07/25/2021

## 2021-07-25 NOTE — Anesthesia Procedure Notes (Signed)
Procedure Name: LMA Insertion Date/Time: 07/25/2021 5:21 AM Performed by: Karoline Caldwell, CRNA Pre-anesthesia Checklist: Patient identified, Patient being monitored, Timeout performed, Emergency Drugs available and Suction available Patient Re-evaluated:Patient Re-evaluated prior to induction Oxygen Delivery Method: Circle system utilized Preoxygenation: Pre-oxygenation with 100% oxygen Induction Type: IV induction Ventilation: Mask ventilation without difficulty LMA: LMA inserted LMA Size: 3.5 Tube type: Oral Number of attempts: 1 Placement Confirmation: positive ETCO2 and breath sounds checked- equal and bilateral Tube secured with: Tape Dental Injury: Teeth and Oropharynx as per pre-operative assessment

## 2021-07-25 NOTE — Transfer of Care (Signed)
Immediate Anesthesia Transfer of Care Note  Patient: Allison [name]  Procedure(s) Performed: DILATATION AND CURETTAGE (Vagina )  Patient Location: PACU  Anesthesia Type:General  Level of Consciousness: awake, alert  and oriented  Airway & Oxygen Therapy: Patient Spontanous Breathing  Post-op Assessment: Report given to RN and Post -op Vital signs reviewed and stable  Post vital signs: Reviewed and stable  Last Vitals:  Vitals Value Taken Time  BP 94/54 07/25/21 0555  Temp    Pulse 60 07/25/21 0557  Resp 9 07/25/21 0557  SpO2 100 % 07/25/21 0557  Vitals [name] include unvalidated device data.  Last Pain:  Vitals:   07/25/21 0400  TempSrc:   PainSc: 0-No pain         Complications: No notable events documented.

## 2021-07-25 NOTE — ED Notes (Signed)
At the end of the triage assessment, pt reported she has been seeing double and having migraines off and on since yesterday. Pt reports dizziness off and on for the past few weeks as well.

## 2021-07-25 NOTE — ED Notes (Signed)
Pt OTF with [place]

## 2021-07-25 NOTE — Anesthesia Preprocedure Evaluation (Signed)
Anesthesia Evaluation  Patient identified by MRN, date of birth, ID band Patient awake    Reviewed: Allergy & Precautions, H&P , NPO status , Patient's Chart, lab work & pertinent test results, reviewed documented beta blocker date and time   Airway Mallampati: II  TM Distance: >3 FB Neck ROM: full    Dental  (+) Teeth Intact   Pulmonary asthma ,    Pulmonary exam normal        Cardiovascular Exercise Tolerance: Good negative cardio ROS Normal cardiovascular exam Rate:Normal     Neuro/Psych  Headaches, Seizures -, Well Controlled,  Anxiety negative psych ROS   GI/Hepatic negative GI ROS, Neg liver ROS,   Endo/Other  negative endocrine ROS  Renal/GU negative Renal ROS  negative genitourinary   Musculoskeletal   Abdominal   Peds  Hematology negative hematology ROS (+)   Anesthesia Other Findings   Reproductive/Obstetrics negative OB ROS                             Anesthesia Physical Anesthesia Plan  ASA: 2 and emergent  Anesthesia Plan: General LMA   Post-op Pain Management:    Induction:   PONV Risk Score and Plan: 4 or greater  Airway Management Planned:   Additional Equipment:   Intra-op Plan:   Post-operative Plan:   Informed Consent: I have reviewed the patients History and Physical, chart, labs and discussed the procedure including the risks, benefits and alternatives for the proposed anesthesia with the patient or authorized representative who has indicated his/her understanding and acceptance.       Plan Discussed with: CRNA  Anesthesia Plan Comments:         Anesthesia Quick Evaluation

## 2021-07-27 NOTE — Anesthesia Postprocedure Evaluation (Signed)
Anesthesia Post Note  Patient: Allison [name]  Procedure(s) Performed: DILATATION AND CURETTAGE (Vagina )  Patient location during evaluation: PACU Anesthesia Type: General Level of consciousness: awake and alert Pain management: pain level controlled Vital Signs Assessment: post-procedure vital signs reviewed and stable Respiratory status: spontaneous breathing, nonlabored ventilation, respiratory function stable and patient connected to nasal cannula oxygen Cardiovascular status: blood pressure returned to baseline and stable Postop Assessment: no apparent nausea or vomiting Anesthetic complications: no   No notable events documented.   Last Vitals:  Vitals:   07/25/21 0659 07/25/21 0700  BP: 100/67 97/60  Pulse: 60 63  Resp: 14 14  Temp: 36.5 C 36.5 C  SpO2: 98% 97%    Last Pain:  Vitals:   07/25/21 0659  TempSrc: Tympanic  PainSc: 7                  Yevette Edwards

## 2021-07-28 LAB — SURGICAL PATHOLOGY

## 2021-07-29 ENCOUNTER — Telehealth: Payer: Self-pay | Admitting: Obstetrics and Gynecology

## 2021-07-29 NOTE — Telephone Encounter (Signed)
Pt called - she had D&C on 09-09 (Friday), pt states that she started having bleeding last night - severe today along with cramping. Pt called her OBGYN - they asked her to follow up with Dr.Cherry who performed the procedure. Pt asked for a call back ASAP. Please Advise.

## 2021-07-30 NOTE — Telephone Encounter (Signed)
Pt called no answer and unable to leave a message due to no vm has been set up.

## 2021-08-20 ENCOUNTER — Encounter: Payer: Self-pay | Admitting: Obstetrics and Gynecology

## 2021-08-20 ENCOUNTER — Ambulatory Visit (INDEPENDENT_AMBULATORY_CARE_PROVIDER_SITE_OTHER): Payer: Medicaid Other | Admitting: Obstetrics and Gynecology

## 2021-08-20 ENCOUNTER — Other Ambulatory Visit: Payer: Self-pay

## 2021-08-20 ENCOUNTER — Telehealth: Payer: Self-pay

## 2021-08-20 VITALS — BP 103/68 | HR 91 | Ht 62.0 in | Wt 177.4 lb

## 2021-08-20 DIAGNOSIS — N938 Other specified abnormal uterine and vaginal bleeding: Secondary | ICD-10-CM | POA: Diagnosis not present

## 2021-08-20 DIAGNOSIS — Z9889 Other specified postprocedural states: Secondary | ICD-10-CM | POA: Diagnosis not present

## 2021-08-20 DIAGNOSIS — Z30430 Encounter for insertion of intrauterine contraceptive device: Secondary | ICD-10-CM | POA: Diagnosis not present

## 2021-08-20 DIAGNOSIS — Z8759 Personal history of other complications of pregnancy, childbirth and the puerperium: Secondary | ICD-10-CM | POA: Diagnosis not present

## 2021-08-20 MED ORDER — NAPROXEN 500 MG PO TABS: 500.0000 mg | ORAL_TABLET | Freq: Two times a day (BID) | ORAL | 1 refills | Status: AC

## 2021-08-20 NOTE — Progress Notes (Signed)
GYNECOLOGY PROGRESS NOTE  Subjective:    Patient ID: Allison [name], female    DOB: Jun 02, 2001, 20 y.o.   MRN: [id number]  HPI  Patient is a 20 y.o. G3P0010 female who presents for persistent bleeding after Dilation and Curettage on 07/25/2021 (performed for persistent bleeding and retained POCs after miscarriage at [redacted] weeks gestation).  Patient notes that since her miscarriage in August, she has continued to bleed. Bleeding alternates between light to moderate flow.  After her D&C, she notes that she only 2 days of no bleeding approximately 2 weeks after the procedure, but then it resumed.  States that when she thinks about attempting to engage in sexual activity the bleeding picks up.  Denies pelvic cramping.   Of note, patient was seen by [facility] in July and August due to threatened abortion. After completion of the miscarraige in August, she was started on Methergine due to persistent bleeding. Notes that the bleeding slowed, but never really stopped.  Followed up in the ER in September as the bleeding became much heavier.   The following portions of the patient's history were reviewed and updated as appropriate:   She  has a past medical history of Asthma, Migraine, and Seizures (HCC).  She  has a past surgical history that includes Tonsillectomy; Adenoidectomy; and Dilation and curettage of uterus (N/A, 07/25/2021).  Her family history includes Ovarian cancer in her maternal aunt; Ovarian cysts in her maternal aunt and mother; Pelvic inflammatory disease in her maternal aunt and mother.  She  reports that she has never smoked. She has never been exposed to tobacco smoke. She has never used smokeless tobacco. She reports that she does not drink alcohol and does not use drugs.  Medications: none  She is allergic to lactose and lactose intolerance (gi)..  Review of Systems Pertinent items noted in HPI and remainder of comprehensive ROS otherwise negative.   Objective:    Blood pressure 103/68, pulse 91, height 5\' 2"  (1.575 m), weight 177 lb 6.4 oz (80.5 kg). Body mass index is 32.45 kg/m. General appearance: alert and no distress Abdomen: soft, non-tender; bowel sounds normal; no masses,  no organomegaly Pelvic: external genitalia normal, rectovaginal septum normal.  Vagina without discharge, small amount of dark red blood present.  Cervix normal appearing, no lesions and no motion tenderness.  Uterus mobile, nontender, normal shape and size.  Adnexae non-palpable, nontender bilaterally.  Extremities: extremities normal, atraumatic, no cyanosis or edema Neurologic: Grossly normal   Pathology (07/25/2021): PRODUCTS OF CONCEPTION; DILATATION AND CURETTAGE:  - CHORIONIC VILLI AND DECIDUA, COMPATIBLE WITH RETAINED PRODUCTS OF  CONCEPTION.  - FRAGMENTS OF BENIGN ENDOMETRIAL AND ENDOCERVICAL TISSUE.  - NEGATIVE FOR MALIGNANCY.   Assessment:   1. DUB (dysfunctional uterine bleeding)   2. S/P dilation and curettage   3. History of miscarriage   4. Encounter for IUD insertion      Plan:   1. DUB (dysfunctional uterine bleeding) - Discussed that dysfunctional could be due to hormonal dysregulation since her miscarriage, or inflammation of the lining after surgery. Advised on options for management depending on future fertility desires. Patient notes that she does not desire pregnancy at this time and would like to discuss contraception. Reviewed all options, patient interested in Mirena IUD.  Will place today. Notes that she has not been sexually active (however no penile penetration) since her procedure due to her bleeding. Also will prescribe Naprosyn to potentially decrease inflammation.   2. S/P dilation and curettage -  Patient s/p D&C for management of retained POCs.   3. History of miscarriage S/p D&C. Does not desire future conception at this time. See below.   4. Encounter for IUD insertion - Mirena IUD inserted today.  See procedure note below.         GYNECOLOGY OFFICE PROCEDURE NOTE  Allison [name] is a 20 y.o. G1P0010 here for Mirena IUD insertion.   IUD Insertion Procedure Note Patient identified, informed consent performed, consent signed.   Discussed risks of irregular bleeding, cramping, infection, malpositioning or misplacement of the IUD outside the uterus which may require further procedure such as laparoscopy. Also discussed >99% contraception efficacy, increased risk of ectopic pregnancy with failure of method.   Emphasized that this did not protect against STIs, condoms recommended during all sexual encounters. Time out was performed.  Urine pregnancy test negative.  Speculum placed in the vagina.  Cervix visualized.  Cleaned with Betadine x 2.  Grasped anteriorly with a single tooth tenaculum.  Uterus sounded to 8 cm.  Mirena IUD placed per manufacturer's recommendations.  Strings trimmed to 3 cm. Tenaculum was removed, good hemostasis noted.  Patient tolerated procedure well.   Patient was given post-procedure instructions.  She was advised to have backup contraception for one week.  Patient was also asked to check IUD strings periodically and follow up in 4-6 weeks for IUD check.     Hildred Laser, MD [facility]

## 2021-08-20 NOTE — Telephone Encounter (Signed)
Spoke to pt concerning her call to the office and informed her that [facility] stated that she had a 4:15pm appt today. Pt stated that she was at work at this time and would see if she could get off to come. Then after speaking to [facility] she stated she had an appt tomorrow at 2:30pm if she could that instead. Tried to call pt back no answer and no way of leaving a vm due to a vm has not been set up.

## 2021-08-20 NOTE — Patient Instructions (Signed)

## 2021-08-20 NOTE — Telephone Encounter (Signed)
Pt called no answer. Voicemail box has not been set up yet to leave message. Pt is not signed up for mychart.

## 2021-10-01 ENCOUNTER — Ambulatory Visit (INDEPENDENT_AMBULATORY_CARE_PROVIDER_SITE_OTHER): Payer: Medicaid Other | Admitting: Obstetrics and Gynecology

## 2021-10-01 ENCOUNTER — Encounter: Payer: Self-pay | Admitting: Obstetrics and Gynecology

## 2021-10-01 ENCOUNTER — Other Ambulatory Visit: Payer: Self-pay

## 2021-10-01 ENCOUNTER — Other Ambulatory Visit (HOSPITAL_COMMUNITY)
Admission: RE | Admit: 2021-10-01 | Discharge: 2021-10-01 | Disposition: A | Discharge status: Home / Self Care | Payer: Medicaid Other | Source: Ambulatory Visit | Attending: Obstetrics and Gynecology | Admitting: Obstetrics and Gynecology

## 2021-10-01 VITALS — BP 111/80 | HR 105 | Resp 16 | Ht 60.0 in | Wt 182.0 lb

## 2021-10-01 DIAGNOSIS — Z30431 Encounter for routine checking of intrauterine contraceptive device: Secondary | ICD-10-CM

## 2021-10-01 DIAGNOSIS — F32A Depression, unspecified: Secondary | ICD-10-CM

## 2021-10-01 DIAGNOSIS — F419 Anxiety disorder, unspecified: Secondary | ICD-10-CM

## 2021-10-01 DIAGNOSIS — N939 Abnormal uterine and vaginal bleeding, unspecified: Secondary | ICD-10-CM | POA: Diagnosis not present

## 2021-10-01 DIAGNOSIS — Z113 Encounter for screening for infections with a predominantly sexual mode of transmission: Secondary | ICD-10-CM | POA: Insufficient documentation

## 2021-10-01 MED ORDER — FLUOXETINE HCL 20 MG PO CAPS: 20.0000 mg | ORAL_CAPSULE | Freq: Every day | ORAL | 1 refills | Status: DC

## 2021-10-01 MED ORDER — ESTRADIOL 2 MG PO TABS: 2.0000 mg | ORAL_TABLET | Freq: Every day | ORAL | 1 refills | Status: DC

## 2021-10-01 NOTE — Progress Notes (Signed)
GYNECOLOGY PROGRESS NOTE  Subjective:    Patient ID: Allison [name], female    DOB: [birth year], 20 y.o.   MRN: [id number]  HPI  Patient is a 20 y.o. G78P0010 female who presents for IUD and STD check. IUD was placed on 08/20/2021. She reports that she is still having bleeding despite IUD placement. Has been having bleeding since her miscarriage in August.  Is s/p D&C for retained products in September.  Notes that the bleeding waxes and wanes, but worsens if she tries to have sex. She has been having lower abdominal and back pain x 1 day, pain is constant. She would also like to be screened for STD's today. She denies having symptoms or concerns for exposure, but just desires to be checked today. Does have a new partner of 3 months, they have only had outercourse.   Allison [name] also desires to discuss management of her anxiety and depression.  Has been on medication in the past but was taken off by her PCP and encouraged patient to f/u with a Psychiatrist but she never found one. Has been at least 2 years since she has been on medication. Cannot recall the name of her medications. Notes that her symptoms have been worse since her miscarriage.   The following portions of the patient's history were reviewed and updated as appropriate: allergies, current medications, past family history, past medical history, past social history, past surgical history, and problem list.  Review of Systems Pertinent items noted in HPI and remainder of comprehensive ROS otherwise negative.   Objective:   Blood pressure 111/80, pulse (!) 105, resp. rate 16, height 5' (1.524 m), weight 182 lb (82.6 kg). Body mass index is 35.54 kg/m. General appearance: alert, cooperative, and no distress Abdomen: soft, non-tender; bowel sounds normal; no masses,  no organomegaly Pelvic: external genitalia normal, rectovaginal septum normal.  Vagina with scant thin brown discharge.  Cervix normal appearing, no lesions and no motion  tenderness. IUD threads  Bimanual exam not performed.  Extremities: extremities normal, atraumatic, no cyanosis or edema Neurologic: Grossly normal   GAD 7 : Generalized Anxiety Score 10/01/2021  Nervous, Anxious, on Edge 3  Control/stop worrying 2  Worry too much - different things 3  Trouble relaxing 2  Restless 3  Easily annoyed or irritable 3  Afraid - awful might happen 3  Total GAD 7 Score 19  Anxiety Difficulty Very difficult    Depression screen [facility] 2/9 10/01/2021 05/14/2021  Decreased Interest 3 0  Down, Depressed, Hopeless 2 0  PHQ - 2 Score 5 0  Altered sleeping 3 -  Tired, decreased energy 3 -  Change in appetite 3 -  Feeling bad or failure about yourself  2 -  Trouble concentrating 3 -  Moving slowly or fidgety/restless 3 -  Suicidal thoughts 0 -  PHQ-9 Score 22 -  Difficult doing work/chores Very difficult -     Assessment:   1. IUD check up   2. Abnormal uterine bleeding   3. Screen for STD (sexually transmitted disease)   4. Anxiety and depression      Plan:   1. IUD check up - IUD in place.  Can keep in for up to 8 years.   2. Abnormal uterine bleeding Discussed that bleeding may persist for up to 3 months s/p IUD placement. Offered supplemental estrogen therapy to help with bleeding, patient ok to try Estradiol for 2 weeks. Will prescribe.   3. Screen for STD (sexually transmitted  disease)  - Cervicitis screening performed at patient's request. Declined serology screen (HIV, HBV/HCV/RPR). Advised on safe sex practices.   4. Anxiety and depression - Discussion had regarding patient's history of anxiety and depression. Chart review performed, notes that patient was on Prozac in 2018.  Offered to resume this medication or trial a different medication. Patient ok to resume.  Also given information on finding a local therapist. Can place referral once counselor selected.    - RTC in 1 month (televisit) to reassess mood symptoms and bleeding.    A  total of 25 minutes were spent face-to-face with the patient during this encounter and over half of that time involved counseling and coordination of care.   Hildred Laser, MD [facility].

## 2021-10-01 NOTE — Patient Instructions (Signed)
Fluoxetine Capsules or Tablets (Depression/Mood Disorders) What is this medication? FLUOXETINE (floo OX e teen) treats depression, anxiety, obsessive-compulsive disorder (OCD), and eating disorders. It increases the amount of serotonin in the brain, a hormone that helps regulate mood. It belongs to a group of medications called SSRIs. This medicine may be used for other purposes; ask your health care provider or pharmacist if you have questions. COMMON BRAND NAME(S): Prozac What should I tell my care team before I take this medication? They need to know if you have any of these conditions: Bipolar disorder or a family history of bipolar disorder Bleeding disorders Glaucoma Heart disease Liver disease Low levels of sodium in the blood Seizures Suicidal thoughts, plans, or attempt; a previous suicide attempt by you or a family member Take MAOIs like Carbex, Eldepryl, Marplan, Nardil, and Parnate Take medications that treat or prevent blood clots Thyroid disease An unusual or allergic reaction to fluoxetine, other medications, foods, dyes, or preservatives Pregnant or trying to get pregnant Breast-feeding How should I use this medication? Take this medication by mouth with a glass of water. Follow the directions on the prescription label. You can take this medication with or without food. Take your medication at regular intervals. Do not take it more often than directed. Do not stop taking this medication suddenly except upon the advice of your care team. Stopping this medication too quickly may cause serious side effects or your condition may worsen. A special MedGuide will be given to you by the pharmacist with each prescription and refill. Be sure to read this information carefully each time. Talk to your care team about the use of this medication in children. While it may be prescribed for children as young as 7 years for selected conditions, precautions do apply. Overdosage: If you think  you have taken too much of this medicine contact a poison control center or emergency room at once. NOTE: This medicine is only for you. Do not share this medicine with others. What if I miss a dose? If you miss a dose, skip the missed dose and go back to your regular dosing schedule. Do not take double or extra doses. What may interact with this medication? Do not take this medication with any of the following: Other medications containing fluoxetine, like Sarafem or Symbyax Cisapride Dronedarone Linezolid MAOIs like Carbex, Eldepryl, Marplan, Nardil, and Parnate Methylene blue (injected into a vein) Pimozide Thioridazine This medication may also interact with the following: Alcohol Amphetamines Aspirin and aspirin-like medications Carbamazepine Certain medications for depression, anxiety, or psychotic disturbances Certain medications for migraine headaches like almotriptan, eletriptan, frovatriptan, naratriptan, rizatriptan, sumatriptan, zolmitriptan Digoxin Diuretics Fentanyl Flecainide Furazolidone Isoniazid Lithium Medications for sleep Medications that treat or prevent blood clots like warfarin, enoxaparin, and dalteparin NSAIDs, medications for pain and inflammation, like ibuprofen or naproxen Other medications that prolong the QT interval (an abnormal heart rhythm) Phenytoin Procarbazine Propafenone Rasagiline Ritonavir Supplements like St. John's wort, kava kava, valerian Tramadol Tryptophan Vinblastine This list may not describe all possible interactions. Give your health care provider a list of all the medicines, herbs, non-prescription drugs, or dietary supplements you use. Also tell them if you smoke, drink alcohol, or use illegal drugs. Some items may interact with your medicine. What should I watch for while using this medication? Tell your care team if your symptoms do not get better or if they get worse. Visit your care team for regular checks on your  progress. Because it may take several weeks to see the   full effects of this medication, it is important to continue your treatment as prescribed. Watch for new or worsening thoughts of suicide or depression. This includes sudden changes in mood, behavior, or thoughts. These changes can happen at any time but are more common in the beginning of treatment or after a change in dose. Call your care team right away if you experience these thoughts or worsening depression. Manic episodes may happen in patients with bipolar disorder who take this medication. Watch for changes in feelings or behaviors such as feeling anxious, nervous, agitated, panicky, irritable, hostile, aggressive, impulsive, severely restless, overly excited and hyperactive, or trouble sleeping. These symptoms can happen at anytime but are more common in the beginning of treatment or after a change in dose. Call your care team right away if you notice any of these symptoms. You may get drowsy or dizzy. Do not drive, use machinery, or do anything that needs mental alertness until you know how this medication affects you. Do not stand or sit up quickly, especially if you are an older patient. This reduces the risk of dizzy or fainting spells. Alcohol may interfere with the effect of this medication. Avoid alcoholic drinks. Your mouth may get dry. Chewing sugarless gum or sucking hard candy, and drinking plenty of water may help. Contact your care team if the problem does not go away or is severe. This medication may affect blood sugar levels. If you have diabetes, check with your care team before you make changes to your diet or medications. What side effects may I notice from receiving this medication? Side effects that you should report to your care team as soon as possible: Allergic reactions--skin rash, itching, hives, swelling of the face, lips, tongue, or throat Bleeding--bloody or black, tar-like stools, red or dark brown urine, vomiting  blood or brown material that looks like coffee grounds, small, red or purple spots on skin, unusual bleeding or bruising Heart rhythm changes--fast or irregular heartbeat, dizziness, feeling faint or lightheaded, chest pain, trouble breathing Loss of appetite with weight loss Low sodium level--muscle weakness, fatigue, dizziness, headache, confusion Serotonin syndrome--irritability, confusion, fast or irregular heartbeat, muscle stiffness, twitching muscles, sweating, high fever, seizure, chills, vomiting, diarrhea Sudden eye pain or change in vision such as blurry vision, seeing halos around lights, vision loss Thoughts of suicide or self-harm, worsening mood, feelings of depression Side effects that usually do not require medical attention (report to your care team if they continue or are bothersome): Anxiety, nervousness Change in sex drive or performance Diarrhea Dry mouth Headache Excessive sweating Nausea Tremors or shaking Trouble sleeping Upset stomach This list may not describe all possible side effects. Call your doctor for medical advice about side effects. You may report side effects to FDA at 1-800-FDA-1088. Where should I keep my medication? Keep out of the reach of children and pets. Store at room temperature between 15 and 30 degrees C (59 and 86 degrees F). Get rid of any unused medication after the expiration date. NOTE: This sheet is a summary. It may not cover all possible information. If you have questions about this medicine, talk to your doctor, pharmacist, or health care provider.  2022 Elsevier/Gold Standard (2021-01-15 00:00:00)

## 2021-10-03 LAB — CERVICOVAGINAL ANCILLARY ONLY
Chlamydia: NEGATIVE
Comment: NEGATIVE
Comment: NEGATIVE
Comment: NORMAL
Neisseria Gonorrhea: NEGATIVE
Trichomonas: NEGATIVE

## 2021-10-31 ENCOUNTER — Telehealth: Payer: Medicaid Other | Admitting: Obstetrics and Gynecology

## 2021-11-18 ENCOUNTER — Telehealth: Payer: Medicaid Other | Admitting: Obstetrics and Gynecology

## 2022-01-21 ENCOUNTER — Telehealth: Payer: Self-pay

## 2022-01-21 NOTE — Telephone Encounter (Signed)
Called LVM to sch appt

## 2022-02-06 ENCOUNTER — Other Ambulatory Visit: Payer: Medicaid Other

## 2022-02-27 ENCOUNTER — Ambulatory Visit (LOCAL_COMMUNITY_HEALTH_CENTER): Payer: Self-pay

## 2022-02-27 DIAGNOSIS — Z111 Encounter for screening for respiratory tuberculosis: Secondary | ICD-10-CM

## 2022-03-02 ENCOUNTER — Ambulatory Visit (LOCAL_COMMUNITY_HEALTH_CENTER): Payer: Self-pay

## 2022-03-02 DIAGNOSIS — Z111 Encounter for screening for respiratory tuberculosis: Secondary | ICD-10-CM

## 2022-03-02 LAB — TB SKIN TEST
Induration: 0 mm
TB Skin Test: NEGATIVE

## 2022-03-15 ENCOUNTER — Encounter: Payer: Self-pay | Admitting: Emergency Medicine

## 2022-03-15 ENCOUNTER — Other Ambulatory Visit: Payer: Self-pay

## 2022-03-15 ENCOUNTER — Emergency Department
Admission: EM | Admit: 2022-03-15 | Discharge: 2022-03-15 | Disposition: A | Discharge status: Home / Self Care | Payer: Medicaid Other | Attending: Emergency Medicine | Admitting: Emergency Medicine

## 2022-03-15 DIAGNOSIS — J069 Acute upper respiratory infection, unspecified: Secondary | ICD-10-CM

## 2022-03-15 DIAGNOSIS — J029 Acute pharyngitis, unspecified: Secondary | ICD-10-CM | POA: Insufficient documentation

## 2022-03-15 DIAGNOSIS — R1030 Lower abdominal pain, unspecified: Secondary | ICD-10-CM | POA: Insufficient documentation

## 2022-03-15 DIAGNOSIS — R112 Nausea with vomiting, unspecified: Secondary | ICD-10-CM | POA: Diagnosis not present

## 2022-03-15 DIAGNOSIS — J45909 Unspecified asthma, uncomplicated: Secondary | ICD-10-CM | POA: Insufficient documentation

## 2022-03-15 DIAGNOSIS — Z20822 Contact with and (suspected) exposure to covid-19: Secondary | ICD-10-CM | POA: Insufficient documentation

## 2022-03-15 DIAGNOSIS — R0602 Shortness of breath: Secondary | ICD-10-CM | POA: Insufficient documentation

## 2022-03-15 DIAGNOSIS — R197 Diarrhea, unspecified: Secondary | ICD-10-CM | POA: Insufficient documentation

## 2022-03-15 LAB — URINALYSIS, ROUTINE W REFLEX MICROSCOPIC
Bilirubin Urine: NEGATIVE
Glucose, UA: NEGATIVE mg/dL
Ketones, ur: NEGATIVE mg/dL
Leukocytes,Ua: NEGATIVE
Nitrite: NEGATIVE
Protein, ur: NEGATIVE mg/dL
Specific Gravity, Urine: 1.015 (ref 1.005–1.030)
pH: 5 (ref 5.0–8.0)

## 2022-03-15 LAB — RESP PANEL BY RT-PCR (FLU A&B, COVID) ARPGX2

## 2022-03-15 LAB — POC URINE PREG, ED: Preg Test, Ur: NEGATIVE

## 2022-03-15 LAB — GROUP A STREP BY PCR: Group A Strep by PCR: NOT DETECTED

## 2022-03-15 MED ORDER — PREDNISONE 10 MG PO TABS: ORAL_TABLET | ORAL | 0 refills | Status: DC

## 2022-03-15 MED ORDER — ALBUTEROL SULFATE HFA 108 (90 BASE) MCG/ACT IN AERS: 2.0000 | INHALATION_SPRAY | Freq: Four times a day (QID) | RESPIRATORY_TRACT | 2 refills | Status: DC | PRN

## 2022-03-15 NOTE — ED Triage Notes (Signed)
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 Pt denies fevers, no distress noted.  ?

## 2022-03-15 NOTE — Discharge Instructions (Signed)
Your strep test was negative.  Your COVID test could not be processed however given the fact that your symptoms have been going on so long, it will not change the treatment plan.  Your urinalysis did not show any evidence of infection.  Urine pregnancy test was negative.  I am putting you on steroids and albuterol.  Please follow-up with your PCP if symptoms persist ?

## 2022-03-15 NOTE — ED Notes (Signed)
Per NP, do not recollect covid swab.  ?

## 2022-03-15 NOTE — ED Provider Notes (Signed)
? ?[facility] ?Provider Note ? ? ? Event Date/Time  ? First MD Initiated Contact with Patient 03/15/22 1413   ?  (approximate) ? ? ?History  ? ?Nausea, Sore Throat, Cough, and Diarrhea ? ? ?HPI ? ?PEGGYE POON is a 21 y.o. female presents to the ER today with complaint of sore throat, cough, shortness of breath, nausea, vomiting and lower abdominal pain.  She reports this started 1 week ago.  She is having some difficulty swallowing.  The cough is mostly nonproductive.  She reports anytime she smells food, she immediately gets nauseous and vomits.  She denies headache, runny nose, nasal congestion, ear pain, constipation, diarrhea, urinary urgency, urinary frequency, dysuria, blood in urine, vaginal discharge, vaginal irritation or abnormal vaginal bleeding.  She is sexually active and is unsure of her last menstrual period she has an IUD.  She has had sick contacts diagnosed with strep. ? ?  ? ? ?Physical Exam  ? ?Triage Vital Signs: ?ED Triage Vitals  ?Enc Vitals Group  ?   BP 03/15/22 1416 116/70  ?   Pulse Rate 03/15/22 1416 100  ?   Resp 03/15/22 1416 18  ?   Temp 03/15/22 1416 98.7 ?F (37.1 ?C)  ?   Temp Source 03/15/22 1416 Oral  ?   SpO2 03/15/22 1416 99 %  ?   Weight 03/15/22 1411 180 lb 12.4 oz (82 kg)  ?   Height 03/15/22 1411 5' (1.524 m)  ?   Head Circumference --   ?   Peak Flow --   ?   Pain Score 03/15/22 1411 6  ?   Pain Loc --   ?   Pain Edu? --   ?   Excl. in [place]? --   ? ? ?Most recent vital signs: ?Vitals:  ? 03/15/22 1416  ?BP: 116/70  ?Pulse: 100  ?Resp: 18  ?Temp: 98.7 ?F (37.1 ?C)  ?SpO2: 99%  ? ? ? ?General: Awake, no distress.  ?ENT:  Posterior pharynx with erythema without exudate. She has had a tonsillectomy. ?Neck:  No cervical lymphadenopathy. ?Resp:  CTA bilaterally.  No wheezes, rales or rhonchi noted ?Abd:  Soft, mild tenderness in bilateral lower quadrants ? ? ?ED Results / Procedures / Treatments  ? ?Labs ?Labs Reviewed  ?RESP PANEL BY RT-PCR (FLU A&B,  COVID) ARPGX2 - Abnormal; Notable for the following components:  ?    Result Value  ? SARS Coronavirus 2 by RT PCR   (*)   ? Value: INVALID, UNABLE TO DETERMINE THE PRESENCE OF TARGET DUE TO SPECIMEN INTEGRITY. RECOLLECTION REQUESTED.  ? Influenza A by PCR   (*)   ? Value: INVALID, UNABLE TO DETERMINE THE PRESENCE OF TARGET DUE TO SPECIMEN INTEGRITY. RECOLLECTION REQUESTED.  ? Influenza B by PCR   (*)   ? Value: INVALID, UNABLE TO DETERMINE THE PRESENCE OF TARGET DUE TO SPECIMEN INTEGRITY. RECOLLECTION REQUESTED.  ? All other components within normal limits  ?URINALYSIS, ROUTINE W REFLEX MICROSCOPIC - Abnormal; Notable for the following components:  ? Color, Urine YELLOW (*)   ? APPearance HAZY (*)   ? Hgb urine dipstick SMALL (*)   ? Bacteria, UA RARE (*)   ? All other components within normal limits  ?GROUP A STREP BY PCR  ?POC URINE PREG, ED  ? ? ? ? ? ?MEDICATIONS ORDERED IN ED: ?Medications - No data to display ? ? ?IMPRESSION / MDM / ASSESSMENT AND PLAN / ED COURSE  ?I reviewed  the triage vital signs and the nursing notes. ? ?Sore throat, cough, shortness of breath, nausea, vomiting, lower abdominal pain ? ?Differential diagnosis includes, but is not limited to, viral pharyngitis, bacterial pharyngitis, viral bronchitis, viral URI with cough, viral gastroenteritis, UTI, pregnancy ? ?COVID/flu/RSV unable to be processed after multiple attempts, discussed this with patient and since her symptoms have been going on >5 days it would not change the treatment plan ?Strep test negative ?Urine pregnancy negative ?Urinalysis shows small blood, rare bacteria-no indication for antibiotics ?She reports her most pressing symptoms at this time are cough, given her history of asthma we will treat with Albuterol and Prednisone taper ?She is agreeable with discharge with current treatment plan ?Work note provided ?FINAL CLINICAL IMPRESSION(S) / ED DIAGNOSES  ? ?Final diagnoses:  ?None  ? ? ? ?Rx / DC Orders  ? ?ED Discharge  Orders   ? ? None  ? ?  ? ? ? ?Note:  This document was prepared using Dragon voice recognition software and may include unintentional dictation errors. ? ?  ?Lorre Munroe, NP ?03/15/22 1638 ? ?  ?Gilles Chiquito, MD ?03/15/22 1942 ? ?

## 2022-04-05 ENCOUNTER — Other Ambulatory Visit: Payer: Self-pay

## 2022-04-05 ENCOUNTER — Emergency Department
Admission: EM | Admit: 2022-04-05 | Discharge: 2022-04-05 | Disposition: A | Discharge status: Home / Self Care | Payer: Medicaid Other | Attending: Emergency Medicine | Admitting: Emergency Medicine

## 2022-04-05 DIAGNOSIS — M5489 Other dorsalgia: Secondary | ICD-10-CM | POA: Insufficient documentation

## 2022-04-05 DIAGNOSIS — M549 Dorsalgia, unspecified: Secondary | ICD-10-CM

## 2022-04-05 LAB — URINALYSIS, ROUTINE W REFLEX MICROSCOPIC
Bilirubin Urine: NEGATIVE
Glucose, UA: NEGATIVE mg/dL
Ketones, ur: NEGATIVE mg/dL
Nitrite: NEGATIVE
Protein, ur: 30 mg/dL — AB
Specific Gravity, Urine: 1.013 (ref 1.005–1.030)
WBC, UA: >50 WBC/hpf — ABNORMAL HIGH (ref 0–5)
pH: 8 (ref 5.0–8.0)

## 2022-04-05 LAB — POC URINE PREG, ED: Preg Test, Ur: NEGATIVE

## 2022-04-05 MED ORDER — METHOCARBAMOL 500 MG PO TABS
500.0000 mg | ORAL_TABLET | Freq: Three times a day (TID) | ORAL | 0 refills | Status: DC | PRN
Start: 2022-04-05 — End: 2022-05-14

## 2022-04-05 MED ORDER — TRAMADOL HCL 50 MG PO TABS: 50.0000 mg | ORAL_TABLET | Freq: Four times a day (QID) | ORAL | 0 refills | Status: DC | PRN

## 2022-04-05 NOTE — ED Triage Notes (Signed)
Pt states she is having lower back pain- pt has a hx of back problems but this is the worst it has gotten- pt states she is having a hard time moving
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
## 2022-04-05 NOTE — Discharge Instructions (Signed)
Please follow-up with your primary care provider if your symptoms or not improving over the week.  Return to the emergency department for symptoms that change or worsen if you are unable to schedule an appointment.

## 2022-04-10 ENCOUNTER — Ambulatory Visit
Admission: RE | Admit: 2022-04-10 | Discharge: 2022-04-10 | Disposition: A | Discharge status: Home / Self Care | Payer: Medicaid Other | Source: Ambulatory Visit | Attending: Family Medicine | Admitting: Family Medicine

## 2022-04-10 ENCOUNTER — Other Ambulatory Visit: Payer: Self-pay | Admitting: Family Medicine

## 2022-04-10 ENCOUNTER — Ambulatory Visit
Admission: RE | Admit: 2022-04-10 | Discharge: 2022-04-10 | Disposition: A | Discharge status: Home / Self Care | Payer: Medicaid Other | Attending: Family Medicine | Admitting: Family Medicine

## 2022-04-10 DIAGNOSIS — M545 Low back pain, unspecified: Secondary | ICD-10-CM

## 2022-05-12 NOTE — Progress Notes (Signed)
    GYNECOLOGY OFFICE PROCEDURE NOTE  Allison [name] is a 21 y.o. G1P0010 here for Liletta IUD removal. Reports that she desires to conceive. No GYN concerns.  Patient has never had a pap smear, not age appropriate.    IUD Removal  Patient identified, informed consent performed, consent signed.  Patient was in the dorsal lithotomy position, normal external genitalia was noted.  A speculum was placed in the patient's vagina, normal discharge was noted, no lesions. The cervix was visualized, no lesions, no abnormal discharge.  The strings of the IUD were grasped and pulled using ring forceps. The IUD was removed in its entirety.  Patient tolerated the procedure well.    Patient plans for pregnancy soon and she was told to avoid teratogens, take PNV and folic acid.  Routine preventative health maintenance measures emphasized.    Hildred Laser, MD [facility]

## 2022-05-13 ENCOUNTER — Ambulatory Visit: Payer: Self-pay | Admitting: Psychiatry

## 2022-05-14 ENCOUNTER — Ambulatory Visit (INDEPENDENT_AMBULATORY_CARE_PROVIDER_SITE_OTHER): Payer: Medicaid Other | Admitting: Obstetrics and Gynecology

## 2022-05-14 ENCOUNTER — Encounter: Payer: Self-pay | Admitting: Obstetrics and Gynecology

## 2022-05-14 VITALS — BP 110/74 | HR 117 | Resp 16 | Ht 61.0 in | Wt 186.7 lb

## 2022-05-14 DIAGNOSIS — Z30432 Encounter for removal of intrauterine contraceptive device: Secondary | ICD-10-CM

## 2022-07-01 ENCOUNTER — Encounter: Payer: Self-pay | Admitting: Psychiatry

## 2022-07-01 ENCOUNTER — Ambulatory Visit (INDEPENDENT_AMBULATORY_CARE_PROVIDER_SITE_OTHER): Payer: Medicaid Other | Admitting: Psychiatry

## 2022-07-01 VITALS — BP 104/72 | HR 83 | Temp 98.5°F | Wt 191.2 lb

## 2022-07-01 DIAGNOSIS — Z9189 Other specified personal risk factors, not elsewhere classified: Secondary | ICD-10-CM

## 2022-07-01 DIAGNOSIS — R413 Other amnesia: Secondary | ICD-10-CM | POA: Diagnosis not present

## 2022-07-01 DIAGNOSIS — O4690 Antepartum hemorrhage, unspecified, unspecified trimester: Secondary | ICD-10-CM | POA: Insufficient documentation

## 2022-07-01 DIAGNOSIS — Z79899 Other long term (current) drug therapy: Secondary | ICD-10-CM

## 2022-07-01 DIAGNOSIS — O200 Threatened abortion: Secondary | ICD-10-CM | POA: Insufficient documentation

## 2022-07-01 DIAGNOSIS — F29 Unspecified psychosis not due to a substance or known physiological condition: Secondary | ICD-10-CM

## 2022-07-01 DIAGNOSIS — F259 Schizoaffective disorder, unspecified: Secondary | ICD-10-CM | POA: Insufficient documentation

## 2022-07-01 DIAGNOSIS — F439 Reaction to severe stress, unspecified: Secondary | ICD-10-CM

## 2022-07-01 DIAGNOSIS — O039 Complete or unspecified spontaneous abortion without complication: Secondary | ICD-10-CM | POA: Insufficient documentation

## 2022-07-01 MED ORDER — ARIPIPRAZOLE 2 MG PO TABS: 2.0000 mg | ORAL_TABLET | Freq: Every morning | ORAL | 1 refills | Status: DC

## 2022-07-01 NOTE — Patient Instructions (Signed)
Please call for EKG - 336 [phone]  [email or web address]  [email or web address]   [facility] [phone]   [facility] [phone]  [facility] [phone]   Aripiprazole Tablets What is this medication? ARIPIPRAZOLE (ay ri PIP ray zole) treats schizophrenia, bipolar I disorder, autism spectrum disorder, and Tourette disorder. It may also be used with antidepressant medications to treat depression. It works by balancing the levels of dopamine and serotonin in the brain, hormones that help regulate mood, behaviors, and thoughts. It belongs to a group of medications called antipsychotics. Antipsychotics can be used to treat several kinds of mental health conditions. This medicine may be used for other purposes; ask your health care provider or pharmacist if you have questions. COMMON BRAND NAME(S): Abilify What should I tell my care team before I take this medication? They need to know if you have any of these conditions: Dementia Diabetes Difficulty swallowing Have trouble controlling your muscles Have urges you are unable to control (for example, gambling, spending money, or eating) Heart disease History of irregular heartbeat History of stroke Low blood counts, like low white cell, platelet, or red cell counts Low blood pressure Parkinson disease Seizures Suicidal thoughts, plans or attempt; a previous suicide attempt by you or a family member An unusual or allergic reaction to aripiprazole, other medications, foods, dyes, or preservatives Pregnant or trying to get pregnant Breast-feeding How should I use this medication? Take this medication by mouth with a glass of water. Follow the directions on the prescription label. You can take this medication with or without food. Take your doses at regular intervals. Do not take your medication more often than directed. Do not stop taking except on the advice of your care team. A special  MedGuide will be given to you by the pharmacist with each prescription and refill. Be sure to read this information carefully each time. Talk to your care team regarding the use of this medication in children. While this medication may be prescribed for children as young as 55 years of age for selected conditions, precautions do apply. Overdosage: If you think you have taken too much of this medicine contact a poison control center or emergency room at once. NOTE: This medicine is only for you. Do not share this medicine with others. What if I miss a dose? If you miss a dose, take it as soon as you can. If it is almost time for your next dose, take only that dose. Do not take double or extra doses. What may interact with this medication? Do not take this medication with any of the following: Brexpiprazole Cisapride Dextromethorphan; quinidine Dronedarone Metoclopramide Pimozide Quinidine Thioridazine This medication may also interact with the following: Antihistamines for allergy, cough, and cold Carbamazepine Certain medications for anxiety or sleep Certain medications for depression like amitriptyline, fluoxetine, paroxetine, sertraline Certain medications for fungal infections like fluconazole, itraconazole, ketoconazole, posaconazole, voriconazole Clarithromycin General anesthetics like halothane, isoflurane, methoxyflurane, propofol Levodopa or other medications for Parkinson's disease Medications for blood pressure Medications for seizures Medications that relax muscles for surgery Narcotic medications for pain Other medications that prolong the QT interval (cause an abnormal heart rhythm) Phenothiazines like chlorpromazine, prochlorperazine Rifampin This list may not describe all possible interactions. Give your health care provider a list of all the medicines, herbs, non-prescription drugs, or dietary supplements you use. Also tell them if you smoke, drink alcohol, or use  illegal drugs. Some items may interact with your  medicine. What should I watch for while using this medication? Visit your care team for regular checks on your progress. Tell your care team if symptoms do not start to get better or if they get worse. Do not stop taking except on your care team's advice. You may develop a severe reaction. Your care team will tell you how much medication to take. Patients and their families should watch out for new or worsening depression or thoughts of suicide. Also watch out for sudden changes in feelings such as feeling anxious, agitated, panicky, irritable, hostile, aggressive, impulsive, severely restless, overly excited and hyperactive, or not being able to sleep. If this happens, especially at the beginning of antidepressant treatment or after a change in dose, call your care team. You may get dizzy or drowsy. Do not drive, use machinery, or do anything that needs mental alertness until you know how this medication affects you. Do not stand or sit up quickly, especially if you are an older patient. This reduces the risk of dizzy or fainting spells. Alcohol may interfere with the effect of this medication. Avoid alcoholic drinks. This medication can cause problems with controlling your body temperature. It can lower the response of your body to cold temperatures. If possible, stay indoors during cold weather. If you must go outdoors, wear warm clothes. It can also lower the response of your body to heat. Do not overheat. Do not over-exercise. Stay out of the sun when possible. If you must be in the sun, wear cool clothing. Drink plenty of water. If you have trouble controlling your body temperature, call your care team right away. This medication may cause dry eyes and blurred vision. If you wear contact lenses, you may feel some discomfort. Lubricating drops may help. See your eye care specialist if the problem does not go away or is severe. This medication may increase  blood sugar. Ask your care team if changes in diet or medications are needed if you have diabetes. There have been reports of increased sexual urges or other strong urges such as gambling while taking this medication. If you experience any of these while taking this medication, you should report this to your care team as soon as possible. What side effects may I notice from receiving this medication? Side effects that you should report to your care team as soon as possible: Allergic reactions--skin rash, itching, hives, swelling of the face, lips, tongue, or throat High blood sugar (hyperglycemia)--increased thirst or amount of urine, unusual weakness or fatigue, blurry vision High fever, stiff muscles, increased sweating, fast or irregular heartbeat, and confusion, which may be signs of neuroleptic malignant syndrome Low blood pressure--dizziness, feeling faint or lightheaded, blurry vision Pain or trouble swallowing Prolonged or painful erection Seizures Stroke--sudden numbness or weakness of the face, arm, or leg, trouble speaking, confusion, trouble walking, loss of balance or coordination, dizziness, severe headache, change in vision Uncontrolled and repetitive body movements, muscle stiffness or spasms, tremors or shaking, loss of balance or coordination, restlessness, shuffling walk, which may be signs of extrapyramidal symptoms (EPS) Thoughts of suicide or self-harm, worsening mood, feelings of depression Urges to engage in impulsive behaviors such as gambling, binge eating, sexual activity, or shopping in ways that are unusual for you Side effects that usually do not require medical attention (report these to your care team if they continue or are bothersome): Constipation Drowsiness Weight gain This list may not describe all possible side effects. Call your doctor for medical advice about side effects.  You may report side effects to FDA at 1-800-FDA-1088. Where should I keep my  medication? Keep out of the reach of children and pets. Store at room temperature between 15 and 30 degrees C (59 and 86 degrees F). Throw away any unused medication after the expiration date. NOTE: This sheet is a summary. It may not cover all possible information. If you have questions about this medicine, talk to your doctor, pharmacist, or health care provider.  2023 Elsevier/Gold Standard (2020-12-05 00:00:00)   Insomnia Insomnia is a sleep disorder that makes it difficult to fall asleep or stay asleep. Insomnia can cause fatigue, low energy, difficulty concentrating, mood swings, and poor performance at work or school. There are three different ways to classify insomnia: Difficulty falling asleep. Difficulty staying asleep. Waking up too early in the morning. Any type of insomnia can be long-term (chronic) or short-term (acute). Both are common. Short-term insomnia usually lasts for 3 months or less. Chronic insomnia occurs at least three times a week for longer than 3 months. What are the causes? Insomnia may be caused by another condition, situation, or substance, such as: Having certain mental health conditions, such as anxiety and depression. Using caffeine, alcohol, tobacco, or drugs. Having gastrointestinal conditions, such as gastroesophageal reflux disease (GERD). Having certain medical conditions. These include: Asthma. Alzheimer's disease. Stroke. Chronic pain. An overactive thyroid gland (hyperthyroidism). Other sleep disorders, such as restless legs syndrome and sleep apnea. Menopause. Sometimes, the cause of insomnia may not be known. What increases the risk? Risk factors for insomnia include: Gender. Females are affected more often than males. Age. Insomnia is more common as people get older. Stress and certain medical and mental health conditions. Lack of exercise. Having an irregular work schedule. This may include working night shifts and traveling between  different time zones. What are the signs or symptoms? If you have insomnia, the main symptom is having trouble falling asleep or having trouble staying asleep. This may lead to other symptoms, such as: Feeling tired or having low energy. Feeling nervous about going to sleep. Not feeling rested in the morning. Having trouble concentrating. Feeling irritable, anxious, or depressed. How is this diagnosed? This condition may be diagnosed based on: Your symptoms and medical history. Your health care provider may ask about: Your sleep habits. Any medical conditions you have. Your mental health. A physical exam. How is this treated? Treatment for insomnia depends on the cause. Treatment may focus on treating an underlying condition that is causing the insomnia. Treatment may also include: Medicines to help you sleep. Counseling or therapy. Lifestyle adjustments to help you sleep better. Follow these instructions at home: Eating and drinking  Limit or avoid alcohol, caffeinated beverages, and products that contain nicotine and tobacco, especially close to bedtime. These can disrupt your sleep. Do not eat a large meal or eat spicy foods right before bedtime. This can lead to digestive discomfort that can make it hard for you to sleep. Sleep habits  Keep a sleep diary to help you and your health care provider figure out what could be causing your insomnia. Write down: When you sleep. When you wake up during the night. How well you sleep and how rested you feel the next day. Any side effects of medicines you are taking. What you eat and drink. Make your bedroom a dark, comfortable place where it is easy to fall asleep. Put up shades or blackout curtains to block light from outside. Use a white noise machine to block noise.  Keep the temperature cool. Limit screen use before bedtime. This includes: Not watching TV. Not using your smartphone, tablet, or computer. Stick to a routine that  includes going to bed and waking up at the same times every day and night. This can help you fall asleep faster. Consider making a quiet activity, such as reading, part of your nighttime routine. Try to avoid taking naps during the day so that you sleep better at night. Get out of bed if you are still awake after 15 minutes of trying to sleep. Keep the lights down, but try reading or doing a quiet activity. When you feel sleepy, go back to bed. General instructions Take over-the-counter and prescription medicines only as told by your health care provider. Exercise regularly as told by your health care provider. However, avoid exercising in the hours right before bedtime. Use relaxation techniques to manage stress. Ask your health care provider to suggest some techniques that may work well for you. These may include: Breathing exercises. Routines to release muscle tension. Visualizing peaceful scenes. Make sure that you drive carefully. Do not drive if you feel very sleepy. Keep all follow-up visits. This is important. Contact a health care provider if: You are tired throughout the day. You have trouble in your daily routine due to sleepiness. You continue to have sleep problems, or your sleep problems get worse. Get help right away if: You have thoughts about hurting yourself or someone else. Get help right away if you feel like you may hurt yourself or others, or have thoughts about taking your own life. Go to your nearest emergency room or: Call 911. Call the National Suicide Prevention Lifeline at 403-048-4463 or 988. This is open 24 hours a day. Text the Crisis Text Line at (220)182-5521. Summary Insomnia is a sleep disorder that makes it difficult to fall asleep or stay asleep. Insomnia can be long-term (chronic) or short-term (acute). Treatment for insomnia depends on the cause. Treatment may focus on treating an underlying condition that is causing the insomnia. Keep a sleep diary to help  you and your health care provider figure out what could be causing your insomnia. This information is not intended to replace advice given to you by your health care provider. Make sure you discuss any questions you have with your health care provider. Document Revised: 10/13/2021 Document Reviewed: 10/13/2021 Elsevier Patient Education  2023 [employer].
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
## 2022-07-23 ENCOUNTER — Telehealth: Payer: Self-pay | Admitting: Obstetrics and Gynecology

## 2022-07-23 ENCOUNTER — Other Ambulatory Visit: Payer: Self-pay | Admitting: Psychiatry

## 2022-07-23 DIAGNOSIS — F29 Unspecified psychosis not due to a substance or known physiological condition: Secondary | ICD-10-CM

## 2022-07-23 NOTE — Telephone Encounter (Signed)
Patient is calling requesting a her HCG levels tested because she has has multiple positive and negative pregnancy test. Please advise? LMP 06/16/22. Please advise?

## 2022-07-24 ENCOUNTER — Other Ambulatory Visit: Payer: Self-pay

## 2022-07-24 DIAGNOSIS — Z3200 Encounter for pregnancy test, result unknown: Secondary | ICD-10-CM

## 2022-08-18 ENCOUNTER — Ambulatory Visit: Payer: Medicaid Other | Admitting: Psychiatry

## 2022-08-19 ENCOUNTER — Other Ambulatory Visit: Payer: Self-pay

## 2022-08-19 ENCOUNTER — Emergency Department
Admission: EM | Admit: 2022-08-19 | Discharge: 2022-08-19 | Disposition: A | Discharge status: Home / Self Care | Payer: Medicaid Other | Attending: Emergency Medicine | Admitting: Emergency Medicine

## 2022-08-19 DIAGNOSIS — G43909 Migraine, unspecified, not intractable, without status migrainosus: Secondary | ICD-10-CM | POA: Diagnosis not present

## 2022-08-19 DIAGNOSIS — R519 Headache, unspecified: Secondary | ICD-10-CM | POA: Diagnosis present

## 2022-08-19 LAB — POC URINE PREG, ED: Preg Test, Ur: NEGATIVE

## 2022-08-19 MED ORDER — PROCHLORPERAZINE MALEATE 10 MG PO TABS: 10.0000 mg | ORAL_TABLET | Freq: Four times a day (QID) | ORAL | 0 refills | Status: DC | PRN

## 2022-08-19 MED ORDER — PROCHLORPERAZINE MALEATE 10 MG PO TABS
10.0000 mg | ORAL_TABLET | Freq: Once | ORAL | Status: AC
  Administered 2022-08-19: 10 mg via ORAL
  Filled 2022-08-19: qty 1

## 2022-08-19 MED ORDER — DIPHENHYDRAMINE HCL 25 MG PO CAPS
50.0000 mg | ORAL_CAPSULE | Freq: Once | ORAL | Status: AC
  Administered 2022-08-19: 50 mg via ORAL
  Filled 2022-08-19: qty 2

## 2022-08-19 NOTE — ED Provider Notes (Signed)
[facility] Provider Note    Event Date/Time   First MD Initiated Contact with Patient 08/19/22 1233     (approximate)   History   Chief Complaint Headache and Nausea   HPI  Allison [name] is a 21 y.o. female with past medical history of migraines, seizures, and schizoaffective disorder who presents to the ED complaining of headache and nausea.  Patient reports that for the past 3 days she has been dealing with gradually worsening bilateral throbbing headache.  She describes the headache as constant, exacerbated by bright lights, and associated with nausea with a couple episodes of vomiting.  She has not had any fevers and denies any neck stiffness.  She also denies any vision changes, speech changes, numbness, or weakness.  She states that symptoms are similar to migraines she had when she was younger, but denies any migraines in the past couple of years.     Physical Exam   Triage Vital Signs: ED Triage Vitals  Enc Vitals Group     BP 08/19/22 1222 112/66     Pulse Rate 08/19/22 1222 65     Resp 08/19/22 1222 18     Temp 08/19/22 1222 98.6 F (37 C)     Temp Source 08/19/22 1222 Oral     SpO2 08/19/22 1222 99 %     Weight 08/19/22 1204 180 lb (81.6 kg)     Height 08/19/22 1204 5' (1.524 m)     Head Circumference --      Peak Flow --      Pain Score 08/19/22 1204 9     Pain Loc --      Pain Edu? --      Excl. in [place]? --     Most recent vital signs: Vitals:   08/19/22 1222  BP: 112/66  Pulse: 65  Resp: 18  Temp: 98.6 F (37 C)  SpO2: 99%    Constitutional: Alert and oriented. Eyes: Conjunctivae are normal. Head: Atraumatic. Nose: No congestion/rhinnorhea. Mouth/Throat: Mucous membranes are moist.  Neck: Supple with no meningismus. Cardiovascular: Normal rate, regular rhythm. Grossly normal heart sounds.  2+ radial pulses bilaterally. Respiratory: Normal respiratory effort.  No retractions. Lungs CTAB. Gastrointestinal: Soft and  nontender. No distention. Musculoskeletal: No lower extremity tenderness nor edema.  Neurologic:  Normal speech and language. No gross focal neurologic deficits are appreciated.    ED Results / Procedures / Treatments   Labs (all labs ordered are listed, but only abnormal results are displayed) Labs Reviewed  URINALYSIS, ROUTINE W REFLEX MICROSCOPIC  POC URINE PREG, ED     PROCEDURES:  Critical Care performed: No  Procedures   MEDICATIONS ORDERED IN ED: Medications  prochlorperazine (COMPAZINE) tablet 10 mg (10 mg Oral Given 08/19/22 1431)  diphenhydrAMINE (BENADRYL) capsule 50 mg (50 mg Oral Given 08/19/22 1431)     IMPRESSION / MDM / ASSESSMENT AND PLAN / ED COURSE  I reviewed the triage vital signs and the nursing notes.                              21 y.o. female with past medical history of migraines, seizures, and schizoaffective disorder who presents to the ED complaining of bilateral throbbing headache that has been gradually worsening over the past 3 days.  Patient's presentation is most consistent with acute complicated illness / injury requiring diagnostic workup.  Differential diagnosis includes, but is not limited to, migraine  headache, tension headache, SAH, meningitis.  Patient nontoxic-appearing and in no acute distress, vital signs are unremarkable and she has a nonfocal neurologic exam.  No features concerning for either meningitis or SAH and we will hold off on CT imaging.  Symptoms seem consistent with migraine headache and patient was offered IV migraine cocktail, but declines and prefers to receive whichever medication "we will get me out of here the fastest."  We will give oral Benadryl and Compazine following pregnancy testing.  Pregnancy testing is negative, patient reports feeling better and is now requesting to leave following dose of Compazine and Benadryl.  She was counseled to follow-up with her PCP and to return to the ED for new or worsening  symptoms, patient agrees with plan.      FINAL CLINICAL IMPRESSION(S) / ED DIAGNOSES   Final diagnoses:  Migraine without status migrainosus, not intractable, unspecified migraine type     Rx / DC Orders   ED Discharge Orders          Ordered    prochlorperazine (COMPAZINE) 10 MG tablet  Every 6 hours PRN        08/19/22 1443             Note:  This document was prepared using Dragon voice recognition software and may include unintentional dictation errors.   Blake Divine, MD 08/19/22 [phone]

## 2022-08-19 NOTE — ED Triage Notes (Signed)
Pt via POV from home. Pt c/o headache and NV since Sunday night. States that she hasn't had a migraine in a long time. Denies cough/congestion. Denies fever. Pt is A&Ox4 and NAD

## 2022-09-07 NOTE — Progress Notes (Signed)
GYNECOLOGY ANNUAL PHYSICAL EXAM PROGRESS NOTE  Subjective:    Allison [name] is a 21 y.o. G52P0010 female who presents for an annual exam.  The patient is sexually active. The patient participates in regular exercise: yes. Has the patient ever been transfused or tattooed?: yes. The patient reports that there is not domestic violence in her life.   The patient has the following complaints/concerns today. Reports that she and her partner have been trying to attempt pregnancy since July when her IUD was removed, and have been unsuccessful so far. Wonders if there is anything else she should be doing. Is currently tracking her cycles.  Cycles were normal, but has not had one for this month.   Menstrual History: Menarche age: 76 Patient's last menstrual period was 07/27/2022.  Period Cycle (Days): 30 Period Duration (Days): 5 Period Pattern: Regular Menstrual Flow: Heavy, Light Menstrual Control: Tampon Menstrual Control Change Freq (Hours): 4 Dysmenorrhea: (!) Severe Dysmenorrhea Symptoms: Cramping, Headache, Nausea    Gynecologic History:  Contraception: none History of STI's: Chlamydia Last Pap: Patient has never had one.      Upstream - 09/08/22 2130       Pregnancy Intention Screening   Does the patient want to become pregnant in the next year? Yes    Does the patient's partner want to become pregnant in the next year? Yes    Would the patient like to discuss contraceptive options today? No      Contraception Wrap Up   Current Method Pregnant/Seeking Pregnancy    End Method Pregnant/Seeking Pregnancy    Contraception Counseling Provided No    How was the end contraceptive method provided? N/A            The pregnancy intention screening data noted above was reviewed. Potential methods of contraception were discussed. The patient elected to proceed with Pregnant/Seeking Pregnancy.    OB History  Gravida Para Term Preterm AB Living  1 0 0 0 1 0  SAB IAB  Ectopic Multiple Live Births  1 0 0 0 0    # Outcome Date GA Lbr Len/2nd Weight Sex Delivery Anes PTL Lv  1 SAB             Past Medical History:  Diagnosis Date   Asthma    Migraine    Seizures ([place])     Past Surgical History:  Procedure Laterality Date   ADENOIDECTOMY     DILATION AND CURETTAGE OF UTERUS N/A 07/25/2021   Procedure: DILATATION AND CURETTAGE;  Surgeon: Rubie Maid, MD;  Location: [facility] ORS;  Service: Gynecology;  Laterality: N/A;   TONSILLECTOMY      Family History  Problem Relation Age of Onset   Depression Mother    Anxiety disorder Mother    Drug abuse Mother    Pelvic inflammatory disease Mother    Ovarian cysts Mother    Alcohol abuse Father    Drug abuse Father    Ovarian cancer Maternal Aunt    Ovarian cysts Maternal Aunt    Pelvic inflammatory disease Maternal Aunt    Depression Paternal Grandmother    Anxiety disorder Paternal Grandmother    Anxiety disorder Half-Sister    Depression Half-Sister    Schizophrenia Half-Sister    Suicidality Half-Sister     Social History   Socioeconomic History   Marital status: Single    Spouse name: Not on file   Number of children: 0   Years of education: Not on file  Highest education level: GED or equivalent  Occupational History   Not on file  Tobacco Use   Smoking status: Never    Passive exposure: Never   Smokeless tobacco: Never  Vaping Use   Vaping Use: Some days   Substances: Nicotine  Substance and Sexual Activity   Alcohol use: Never   Drug use: Never   Sexual activity: Yes    Birth control/protection: None  Other Topics Concern   Not on file  Social History Narrative   Not on file   Social Determinants of Health   Financial Resource Strain: Not on file  Food Insecurity: Not on file  Transportation Needs: Not on file  Physical Activity: Not on file  Stress: Not on file  Social Connections: Not on file  Intimate Partner Violence: Not At Risk (05/14/2021)   Humiliation,  Afraid, Rape, and Kick questionnaire    Fear of Current or Ex-Partner: No    Emotionally Abused: No    Physically Abused: No    Sexually Abused: No    Current Outpatient Medications on File Prior to Visit  Medication Sig Dispense Refill   Acetaminophen (MIDOL PO) Take by mouth.     ARIPiprazole (ABILIFY) 2 MG tablet TAKE 1 TABLET (2 MG TOTAL) BY MOUTH IN THE MORNING 90 tablet 0   prochlorperazine (COMPAZINE) 10 MG tablet Take 1 tablet (10 mg total) by mouth every 6 (six) hours as needed for nausea or vomiting. 12 tablet 0   No current facility-administered medications on file prior to visit.    Allergies  Allergen Reactions   Lactose     Other reaction(s): Other (See Comments) Intolerance   Lactose Intolerance (Gi)      Review of Systems Constitutional: negative for chills, fatigue, fevers and sweats Eyes: negative for irritation, redness and visual disturbance Ears, nose, mouth, throat, and face: negative for hearing loss, nasal congestion, snoring and tinnitus Respiratory: negative for asthma, cough, sputum Cardiovascular: negative for chest pain, dyspnea, exertional chest pressure/discomfort, irregular heart beat, palpitations and syncope Gastrointestinal: negative for abdominal pain, change in bowel habits, nausea and vomiting Genitourinary: negative for abnormal menstrual periods, genital lesions, sexual problems and vaginal discharge, dysuria and urinary incontinence Integument/breast: negative for breast lump, breast tenderness and nipple discharge Hematologic/lymphatic: negative for bleeding and easy bruising Musculoskeletal:negative for back pain and muscle weakness Neurological: negative for dizziness, headaches, vertigo and weakness Endocrine: negative for diabetic symptoms including polydipsia, polyuria and skin dryness Allergic/Immunologic: negative for hay fever and urticaria      Objective:  Blood pressure 105/73, pulse 84, resp. rate 16, height 5\' 1"  (1.549  m), weight 190 lb 12.8 oz (86.5 kg), last menstrual period 07/27/2022. Body mass index is 36.05 kg/m.  General Appearance:    Alert, cooperative, no distress, appears stated age, moderate obesity  Head:    Normocephalic, without obvious abnormality, atraumatic  Eyes:    PERRL, conjunctiva/corneas clear, EOM's intact, both eyes  Ears:    Normal external ear canals, both ears  Nose:   Nares normal, septum midline, mucosa normal, no drainage or sinus tenderness  Throat:   Lips, mucosa, and tongue normal; teeth and gums normal  Neck:   Supple, symmetrical, trachea midline, no adenopathy; thyroid: no enlargement/tenderness/nodules; no carotid bruit or JVD  Back:     Symmetric, no curvature, ROM normal, no CVA tenderness  Lungs:     Clear to auscultation bilaterally, respirations unlabored  Chest Wall:    No tenderness or deformity   Heart:  Regular rate and rhythm, S1 and S2 normal, no murmur, rub or gallop  Breast Exam:    No tenderness, masses, or nipple abnormality  Abdomen:     Soft, non-tender, bowel sounds active all four quadrants, no masses, no organomegaly.    Genitalia:    Pelvic:external genitalia normal, vagina without lesions, discharge, or tenderness, rectovaginal septum  normal. Cervix normal in appearance, no cervical motion tenderness, no adnexal masses or tenderness.  Uterus normal size, shape, mobile, regular contours, nontender.  Rectal:    Normal external sphincter.  No hemorrhoids appreciated. Internal exam not done.   Extremities:   Extremities normal, atraumatic, no cyanosis or edema  Pulses:   2+ and symmetric all extremities  Skin:   Skin color, texture, turgor normal, no rashes or lesions  Lymph nodes:   Cervical, supraclavicular, and axillary nodes normal  Neurologic:   CNII-XII intact, normal strength, sensation and reflexes throughout   .  Labs:   Results for orders placed or performed in visit on 09/08/22  POCT urine pregnancy  Result Value Ref Range   Preg  Test, Ur Negative Negative    Lab Results  Component Value Date   WBC 9.1 07/25/2021   HGB 12.3 07/25/2021   HCT 35.9 (L) 07/25/2021   MCV 91.6 07/25/2021   PLT 333 07/25/2021    Lab Results  Component Value Date   CREATININE 0.68 07/25/2021   BUN 8 07/25/2021   NA 138 07/25/2021   K 3.4 (L) 07/25/2021   CL 109 07/25/2021   CO2 23 07/25/2021    Lab Results  Component Value Date   ALT 17 01/06/2021   AST 18 01/06/2021   ALKPHOS 54 01/06/2021   BILITOT 0.7 01/06/2021    Lab Results  Component Value Date   TSH 0.561 01/06/2021     Assessment:   1. Encounter for well woman exam with routine gynecological exam   2. Cervical cancer screening   3. Amenorrhea   4. Screening for lipid disorders   5. Screen for STD (sexually transmitted disease)   6. Pre-conception counseling      Plan:  - Blood tests: CBC with diff, Comprehensive metabolic panel, and Lipoproteins. - Breast self exam technique reviewed and patient encouraged to perform self-exam monthly. - Contraception: none. Desires pregnancy. Discussed improving overall health (lifestyle and dietary modifications). Also advised on timed coitus, remaining supine for 15-20 minutes post coitus, and OTC supplements to help improve fertility. Patient already taking a PNV. Advised on infertility workup if pregnancy does not occur within 12 months.  - Discussed healthy lifestyle modifications. Notes she has gained ~ 20 lbs over since her last visit.  - Pap smear ordered. COVID vaccination status: Declines - Flu Vaccine: Declined - Amenorrhea: if cycle does not resume within the next month, can perform Provera challenge.  UPT performed today negative.  Follow up in 1 year for annual exam.    Rubie Maid, MD [place]

## 2022-09-08 ENCOUNTER — Other Ambulatory Visit (HOSPITAL_COMMUNITY)
Admission: RE | Admit: 2022-09-08 | Discharge: 2022-09-08 | Disposition: A | Discharge status: Home / Self Care | Payer: Medicaid Other | Source: Ambulatory Visit | Attending: Obstetrics and Gynecology | Admitting: Obstetrics and Gynecology

## 2022-09-08 ENCOUNTER — Encounter: Payer: Self-pay | Admitting: Obstetrics and Gynecology

## 2022-09-08 ENCOUNTER — Ambulatory Visit (INDEPENDENT_AMBULATORY_CARE_PROVIDER_SITE_OTHER): Payer: Medicaid Other | Admitting: Obstetrics and Gynecology

## 2022-09-08 VITALS — BP 105/73 | HR 84 | Resp 16 | Ht 61.0 in | Wt 190.8 lb

## 2022-09-08 DIAGNOSIS — Z01419 Encounter for gynecological examination (general) (routine) without abnormal findings: Secondary | ICD-10-CM | POA: Insufficient documentation

## 2022-09-08 DIAGNOSIS — Z3169 Encounter for other general counseling and advice on procreation: Secondary | ICD-10-CM

## 2022-09-08 DIAGNOSIS — Z3202 Encounter for pregnancy test, result negative: Secondary | ICD-10-CM | POA: Diagnosis not present

## 2022-09-08 DIAGNOSIS — Z113 Encounter for screening for infections with a predominantly sexual mode of transmission: Secondary | ICD-10-CM | POA: Diagnosis not present

## 2022-09-08 DIAGNOSIS — Z124 Encounter for screening for malignant neoplasm of cervix: Secondary | ICD-10-CM

## 2022-09-08 DIAGNOSIS — N912 Amenorrhea, unspecified: Secondary | ICD-10-CM | POA: Diagnosis not present

## 2022-09-08 DIAGNOSIS — Z13220 Encounter for screening for lipoid disorders: Secondary | ICD-10-CM

## 2022-09-08 LAB — POCT URINE PREGNANCY: Preg Test, Ur: NEGATIVE

## 2022-09-08 NOTE — Patient Instructions (Signed)
Breast Self-Awareness Breast self-awareness is knowing how your breasts look and feel. You need to: Check your breasts on a regular basis. Tell your doctor about any changes. Become familiar with the look and feel of your breasts. This can help you catch a breast problem while it is still small and can be treated. You should do breast self-exams even if you have breast implants. What you need: A mirror. A well-lit room. A pillow or other soft object. How to do a breast self-exam Follow these steps to do a breast self-exam: Look for changes  Take off all the clothes above your waist. Stand in front of a mirror in a room with good lighting. Put your hands down at your sides. Compare your breasts in the mirror. Look for any difference between them, such as: A difference in shape. A difference in size. Wrinkles, dips, and bumps in one breast and not the other. Look at each breast for changes in the skin, such as: Redness. Scaly areas. Skin that has gotten thicker. Dimpling. Open sores (ulcers). Look for changes in your nipples, such as: Fluid coming out of a nipple. Fluid around a nipple. Bleeding. Dimpling. Redness. A nipple that looks pushed in (retracted), or that has changed position. Feel for changes Lie on your back. Feel each breast. To do this: Pick a breast to feel. Place a pillow under the shoulder closest to that breast. Put the arm closest to that breast behind your head. Feel the nipple area of that breast using the hand of your other arm. Feel the area with the pads of your three middle fingers by making small circles with your fingers. Use light, medium, and firm pressure. Continue the overlapping circles, moving downward over the breast. Keep making circles with your fingers. Stop when you feel your ribs. Start making circles with your fingers again, this time going upward until you reach your collarbone. Then, make circles outward across your breast and into your  armpit area. Squeeze your nipple. Check for discharge and lumps. Repeat these steps to check your other breast. Sit or stand in the tub or shower. With soapy water on your skin, feel each breast the same way you did when you were lying down. Write down what you find Writing down what you find can help you remember what to tell your doctor. Write down: What is normal for each breast. Any changes you find in each breast. These include: The kind of changes you find. A tender or painful breast. Any lump you find. Write down its size and where it is. When you last had your monthly period (menstrual cycle). General tips If you are breastfeeding, the best time to check your breasts is after you feed your baby or after you use a breast pump. If you get monthly bleeding, the best time to check your breasts is 5-7 days after your monthly cycle ends. With time, you will become comfortable with the self-exam. You will also start to know if there are changes in your breasts. Contact a doctor if: You see a change in the shape or size of your breasts or nipples. You see a change in the skin of your breast or nipples, such as red or scaly skin. You have fluid coming from your nipples that is not normal. You find a new lump or thick area. You have breast pain. You have any concerns about your breast health. Summary Breast self-awareness includes looking for changes in your breasts and feeling for changes   see your health care provider for a prepregnancy visit. Find healthy ways to manage stress, such as: Meditation, yoga, or listening to music. Journaling. Talking to a trusted person. Spending time  with friends and family. Safety Always wear your seat belt while driving or riding in a vehicle. Do not drive: If you have been drinking alcohol. Do not ride with someone who has been drinking. When you are tired or distracted. While texting. If you have been using any mind-altering substances or drugs. Wear a helmet and other protective equipment during sports activities. If you have firearms in your house, make sure you follow all gun safety procedures. Seek help if you have been bullied, physically abused, or sexually abused. Use the internet responsibly to avoid dangers, such as online bullying and online sex predators. What's next? Go to your health care provider once a year for an annual wellness visit. Ask your health care provider how often you should have your eyes and teeth checked. Stay up to date on all vaccines. This information is not intended to replace advice given to you by your health care provider. Make sure you discuss any questions you have with your health care provider. Document Revised: 04/30/2021 Document Reviewed: 04/30/2021 Elsevier Patient Education  2023 Elsevier Inc.    or one 1 oz glass of hard liquor (44 mL). Lifestyle Brush your teeth every morning and night with fluoride toothpaste. Floss one time each day. Exercise for at least 30 minutes 5 or more days of the week. Do not use any products that contain nicotine or tobacco. These products include cigarettes, chewing tobacco, and vaping devices, such as e-cigarettes. If you need help quitting, ask your health care provider. Do not use drugs. If you are sexually active, practice safe sex. Use a condom or other form of protection to prevent STIs. If you do not wish to become pregnant, use a form of birth control. If you plan to become pregnant, see your health care provider for a  prepregnancy visit. Find healthy ways to manage stress, such as: Meditation, yoga, or listening to music. Journaling. Talking to a trusted person. Spending time with friends and family. Safety Always wear your seat belt while driving or riding in a vehicle. Do not drive: If you have been drinking alcohol. Do not ride with someone who has been drinking. When you are tired or distracted. While texting. If you have been using any mind-altering substances or drugs. Wear a helmet and other protective equipment during sports activities. If you have firearms in your house, make sure you follow all gun safety procedures. Seek help if you have been bullied, physically abused, or sexually abused. Use the internet responsibly to avoid dangers, such as online bullying and online sex predators. What's next? Go to your health care provider once a year for an annual wellness visit. Ask your health care provider how often you should have your eyes and teeth checked. Stay up to date on all vaccines. This information is not intended to replace advice given to you by your health care provider. Make sure you discuss any questions you have with your health care provider. Document Revised: 04/30/2021 Document Reviewed: 04/30/2021 Elsevier Patient Education  [place].

## 2022-09-09 LAB — COMPREHENSIVE METABOLIC PANEL
ALT: 23 IU/L (ref 0–32)
AST: 19 IU/L (ref 0–40)
Albumin/Globulin Ratio: 2 (ref 1.2–2.2)
Albumin: 4.7 g/dL (ref 4.0–5.0)
Alkaline Phosphatase: 81 IU/L (ref 44–121)
BUN/Creatinine Ratio: 13 (ref 9–23)
BUN: 10 mg/dL (ref 6–20)
Bilirubin Total: <0.2 mg/dL (ref 0.0–1.2)
CO2: 21 mmol/L (ref 20–29)
Calcium: 10.1 mg/dL (ref 8.7–10.2)
Chloride: 105 mmol/L (ref 96–106)
Creatinine, Ser: 0.79 mg/dL (ref 0.57–1.00)
Globulin, Total: 2.4 g/dL (ref 1.5–4.5)
Glucose: 76 mg/dL (ref 70–99)
Potassium: 4.4 mmol/L (ref 3.5–5.2)
Sodium: 142 mmol/L (ref 134–144)
Total Protein: 7.1 g/dL (ref 6.0–8.5)
eGFR: 109 mL/min/{1.73_m2} (ref >59)

## 2022-09-09 LAB — CBC
Hematocrit: 42 % (ref 34.0–46.6)
Hemoglobin: 13.9 g/dL (ref 11.1–15.9)
MCH: 28.9 pg (ref 26.6–33.0)
MCHC: 33.1 g/dL (ref 31.5–35.7)
MCV: 87 fL (ref 79–97)
Platelets: 340 10*3/uL (ref 150–450)
RBC: 4.81 x10E6/uL (ref 3.77–5.28)
RDW: 12.9 % (ref 11.7–15.4)
WBC: 6.8 10*3/uL (ref 3.4–10.8)

## 2022-09-09 LAB — LIPID PANEL
Chol/HDL Ratio: 5.7 ratio — ABNORMAL HIGH (ref 0.0–4.4)
Cholesterol, Total: 171 mg/dL (ref 100–199)
HDL: 30 mg/dL — ABNORMAL LOW (ref >39)
LDL Chol Calc (NIH): 122 mg/dL — ABNORMAL HIGH (ref 0–99)
Triglycerides: 103 mg/dL (ref 0–149)
VLDL Cholesterol Cal: 19 mg/dL (ref 5–40)

## 2022-09-09 LAB — HIV ANTIBODY (ROUTINE TESTING W REFLEX): HIV Screen 4th Generation wRfx: NONREACTIVE

## 2022-09-09 LAB — HEPATITIS C ANTIBODY: Hep C Virus Ab: NONREACTIVE

## 2022-09-09 LAB — RPR: RPR Ser Ql: NONREACTIVE

## 2022-09-10 LAB — CYTOLOGY - PAP: Diagnosis: NEGATIVE

## 2022-11-15 ENCOUNTER — Other Ambulatory Visit: Payer: Self-pay

## 2022-11-15 ENCOUNTER — Emergency Department: Payer: Medicaid Other

## 2022-11-15 ENCOUNTER — Emergency Department
Admission: EM | Admit: 2022-11-15 | Discharge: 2022-11-15 | Disposition: A | Discharge status: Home / Self Care | Payer: Medicaid Other | Attending: Emergency Medicine | Admitting: Emergency Medicine

## 2022-11-15 DIAGNOSIS — Z1152 Encounter for screening for COVID-19: Secondary | ICD-10-CM | POA: Insufficient documentation

## 2022-11-15 DIAGNOSIS — J45909 Unspecified asthma, uncomplicated: Secondary | ICD-10-CM | POA: Diagnosis not present

## 2022-11-15 DIAGNOSIS — R1030 Lower abdominal pain, unspecified: Secondary | ICD-10-CM | POA: Diagnosis not present

## 2022-11-15 LAB — COMPREHENSIVE METABOLIC PANEL
ALT: 21 U/L (ref 0–44)
AST: 20 U/L (ref 15–41)
Albumin: 4 g/dL (ref 3.5–5.0)
Alkaline Phosphatase: 54 U/L (ref 38–126)
Anion gap: 8 (ref 5–15)
BUN: 11 mg/dL (ref 6–20)
CO2: 21 mmol/L — ABNORMAL LOW (ref 22–32)
Calcium: 9.1 mg/dL (ref 8.9–10.3)
Chloride: 111 mmol/L (ref 98–111)
Creatinine, Ser: 0.76 mg/dL (ref 0.44–1.00)
GFR, Estimated: >60 mL/min (ref >60)
Glucose, Bld: 98 mg/dL (ref 70–99)
Potassium: 3.5 mmol/L (ref 3.5–5.1)
Sodium: 140 mmol/L (ref 135–145)
Total Bilirubin: 0.5 mg/dL (ref 0.3–1.2)
Total Protein: 7.1 g/dL (ref 6.5–8.1)

## 2022-11-15 LAB — URINALYSIS, ROUTINE W REFLEX MICROSCOPIC
Bilirubin Urine: NEGATIVE
Glucose, UA: NEGATIVE mg/dL
Hgb urine dipstick: NEGATIVE
Ketones, ur: 5 mg/dL — AB
Nitrite: NEGATIVE
Protein, ur: NEGATIVE mg/dL
Specific Gravity, Urine: 1.026 (ref 1.005–1.030)
pH: 5 (ref 5.0–8.0)

## 2022-11-15 LAB — CBC
HCT: 37.7 % (ref 36.0–46.0)
Hemoglobin: 12.3 g/dL (ref 12.0–15.0)
MCH: 29.6 pg (ref 26.0–34.0)
MCHC: 32.6 g/dL (ref 30.0–36.0)
MCV: 90.8 fL (ref 80.0–100.0)
Platelets: 298 10*3/uL (ref 150–400)
RBC: 4.15 MIL/uL (ref 3.87–5.11)
RDW: 13.2 % (ref 11.5–15.5)
WBC: 8.4 10*3/uL (ref 4.0–10.5)
nRBC: 0 % (ref 0.0–0.2)

## 2022-11-15 LAB — POC URINE PREG, ED: Preg Test, Ur: NEGATIVE

## 2022-11-15 LAB — RESP PANEL BY RT-PCR (RSV, FLU A&B, COVID)  RVPGX2
Influenza A by PCR: NEGATIVE
Influenza B by PCR: NEGATIVE
Resp Syncytial Virus by PCR: NEGATIVE
SARS Coronavirus 2 by RT PCR: NEGATIVE

## 2022-11-15 LAB — LIPASE, BLOOD: Lipase: 32 U/L (ref 11–51)

## 2022-11-15 MED ORDER — OXYCODONE-ACETAMINOPHEN 5-325 MG PO TABS: 1.0000 | ORAL_TABLET | Freq: Once | ORAL | Status: DC

## 2022-11-15 MED ORDER — KETOROLAC TROMETHAMINE 30 MG/ML IJ SOLN
30.0000 mg | Freq: Once | INTRAMUSCULAR | Status: AC
  Administered 2022-11-15: 30 mg via INTRAMUSCULAR
  Filled 2022-11-15: qty 1

## 2022-11-15 NOTE — ED Triage Notes (Signed)
Pt now reporting dark colored urine x 2 days. Denies painful urination or increased frequency

## 2022-11-15 NOTE — ED Triage Notes (Signed)
Lower abd pain x 2 days. Denies emesis or diarrhea but reports mild nausea. Reports mild h/a tonight as well. Denies cough, congestion, fevers. Denies hx of abd surgeries. Pt alert and oriented following commands. Breathing unlabored speaking in full sentences. Symmetric chest rise and fall.

## 2022-11-15 NOTE — ED Provider Notes (Addendum)
[facility] Provider Note    Event Date/Time   First MD Initiated Contact with Patient 11/15/22 [phone]     (approximate)   History   Chief Complaint Abdominal Pain   HPI  Allison [name] is a 21 y.o. female with past medical history of migraines, asthma, and seizures who presents to the ED complaining of abdominal pain.  Patient reports that she has been dealing with waxing and waning pain in the bilateral lower quadrants of her abdomen as well as her pelvic area for the past 2 days.  Pain does not seem to be exacerbated or alleviated by nothing particular, has been associated with some nausea but she denies any vomiting or changes in her bowel movements.  She has not had any fevers, dysuria, hematuria, flank pain, vaginal bleeding or discharge.  She states her LMP was approximately 2 weeks ago.     Physical Exam   Triage Vital Signs: ED Triage Vitals [11/15/22 0033]  Enc Vitals Group     BP 118/75     Pulse Rate 88     Resp 18     Temp 98.6 F (37 C)     Temp Source Oral     SpO2 98 %     Weight 180 lb (81.6 kg)     Height 5\' 1"  (1.549 m)     Head Circumference      Peak Flow      Pain Score 7     Pain Loc      Pain Edu?      Excl. in GC?     Most recent vital signs: Vitals:   11/15/22 0033 11/15/22 0546  BP: 118/75 110/80  Pulse: 88 73  Resp: 18 18  Temp: 98.6 F (37 C) 97.6 F (36.4 C)  SpO2: 98% 99%    Constitutional: Alert and oriented. Eyes: Conjunctivae are normal. Head: Atraumatic. Nose: No congestion/rhinnorhea. Mouth/Throat: Mucous membranes are moist.  Cardiovascular: Normal rate, regular rhythm. Grossly normal heart sounds.  2+ radial pulses bilaterally. Respiratory: Normal respiratory effort.  No retractions. Lungs CTAB. Gastrointestinal: Soft and tender to palpation in the bilateral lower quadrants with no rebound or guarding. No distention. Musculoskeletal: No lower extremity tenderness nor edema.  Neurologic:   Normal speech and language. No gross focal neurologic deficits are appreciated.    ED Results / Procedures / Treatments   Labs (all labs ordered are listed, but only abnormal results are displayed) Labs Reviewed  COMPREHENSIVE METABOLIC PANEL - Abnormal; Notable for the following components:      Result Value   CO2 21 (*)    All other components within normal limits  URINALYSIS, ROUTINE W REFLEX MICROSCOPIC - Abnormal; Notable for the following components:   Color, Urine YELLOW (*)    APPearance CLOUDY (*)    Ketones, ur 5 (*)    Leukocytes,Ua SMALL (*)    Bacteria, UA RARE (*)    All other components within normal limits  RESP PANEL BY RT-PCR (RSV, FLU A&B, COVID)  RVPGX2  LIPASE, BLOOD  CBC  POC URINE PREG, ED   RADIOLOGY Pelvic ultrasound reviewed and interpreted by me with no evidence of torsion.  PROCEDURES:  Critical Care performed: No  Procedures   MEDICATIONS ORDERED IN ED: Medications  oxyCODONE-acetaminophen (PERCOCET/ROXICET) 5-325 MG per tablet 1 tablet (has no administration in time range)  ketorolac (TORADOL) 30 MG/ML injection 30 mg (30 mg Intramuscular Given 11/15/22 0402)     IMPRESSION / MDM /  ASSESSMENT AND PLAN / ED COURSE  I reviewed the triage vital signs and the nursing notes.                              21 y.o. female with past medical history of migraines, asthma, and seizures who presents to the ED complaining of 2 days of waxing and waning bilateral lower quadrant abdominal pain extending into her pelvic area.  Patient's presentation is most consistent with acute presentation with potential threat to life or bodily function.  Differential diagnosis includes, but is not limited to, ovarian torsion, ovarian cyst, UTI, kidney stone, appendicitis, diverticulitis.  Patient well-appearing and in no acute distress, vital signs are unremarkable.  She has tenderness to palpation in the bilateral lower quadrants of her abdomen, no rebound or  guarding and low suspicion for appendicitis at this time.  Will further assess with ultrasound to rule out torsion or other ovarian pathology.  Labs are reassuring with no significant anemia, leukocytosis, electrolyte abnormality, or AKI.  LFTs and lipase are unremarkable.  Pregnancy testing is negative and urinalysis appears contaminated, low suspicion for UTI given lack of urinary symptoms.  Plan to treat symptomatically with IM Toradol and reassess.  Pelvic ultrasound is unremarkable, no evidence of torsion or other pathology.  Patient was offered CT imaging for further assessment, but declines.  Patient is appropriate for discharge home with PCP follow-up, was counseled to return to the ED for new or worsening symptoms.      FINAL CLINICAL IMPRESSION(S) / ED DIAGNOSES   Final diagnoses:  Lower abdominal pain     Rx / DC Orders   ED Discharge Orders     None        Note:  This document was prepared using Dragon voice recognition software and may include unintentional dictation errors.   Chesley Noon, MD 11/15/22 [id number]    Chesley Noon, MD 11/15/22 [phone]

## 2022-12-04 ENCOUNTER — Ambulatory Visit: Payer: Medicaid Other

## 2023-02-23 ENCOUNTER — Emergency Department: Payer: Medicaid Other

## 2023-02-23 ENCOUNTER — Emergency Department
Admission: EM | Admit: 2023-02-23 | Discharge: 2023-02-23 | Disposition: A | Discharge status: Home / Self Care | Payer: Medicaid Other | Attending: Emergency Medicine | Admitting: Emergency Medicine

## 2023-02-23 DIAGNOSIS — R42 Dizziness and giddiness: Secondary | ICD-10-CM | POA: Diagnosis not present

## 2023-02-23 DIAGNOSIS — R079 Chest pain, unspecified: Secondary | ICD-10-CM

## 2023-02-23 DIAGNOSIS — R0789 Other chest pain: Secondary | ICD-10-CM | POA: Diagnosis not present

## 2023-02-23 DIAGNOSIS — J45909 Unspecified asthma, uncomplicated: Secondary | ICD-10-CM | POA: Insufficient documentation

## 2023-02-23 LAB — COMPREHENSIVE METABOLIC PANEL
ALT: 19 U/L (ref 0–44)
AST: 20 U/L (ref 15–41)
Albumin: 4.7 g/dL (ref 3.5–5.0)
Alkaline Phosphatase: 66 U/L (ref 38–126)
Anion gap: 8 (ref 5–15)
BUN: 14 mg/dL (ref 6–20)
CO2: 24 mmol/L (ref 22–32)
Calcium: 9.6 mg/dL (ref 8.9–10.3)
Chloride: 108 mmol/L (ref 98–111)
Creatinine, Ser: 0.79 mg/dL (ref 0.44–1.00)
GFR, Estimated: >60 mL/min (ref >60)
Glucose, Bld: 83 mg/dL (ref 70–99)
Potassium: 3.9 mmol/L (ref 3.5–5.1)
Sodium: 140 mmol/L (ref 135–145)
Total Bilirubin: 0.6 mg/dL (ref 0.3–1.2)
Total Protein: 7.8 g/dL (ref 6.5–8.1)

## 2023-02-23 LAB — CBC WITH DIFFERENTIAL/PLATELET
Abs Immature Granulocytes: 0.04 10*3/uL (ref 0.00–0.07)
Basophils Absolute: 0 10*3/uL (ref 0.0–0.1)
Basophils Relative: 0 %
Eosinophils Absolute: 0.1 10*3/uL (ref 0.0–0.5)
Eosinophils Relative: 1 %
HCT: 41 % (ref 36.0–46.0)
Hemoglobin: 13.4 g/dL (ref 12.0–15.0)
Immature Granulocytes: 0 %
Lymphocytes Relative: 24 %
Lymphs Abs: 2.2 10*3/uL (ref 0.7–4.0)
MCH: 29.8 pg (ref 26.0–34.0)
MCHC: 32.7 g/dL (ref 30.0–36.0)
MCV: 91.1 fL (ref 80.0–100.0)
Monocytes Absolute: 0.5 10*3/uL (ref 0.1–1.0)
Monocytes Relative: 6 %
Neutro Abs: 6.6 10*3/uL (ref 1.7–7.7)
Neutrophils Relative %: 69 %
Platelets: 337 10*3/uL (ref 150–400)
RBC: 4.5 MIL/uL (ref 3.87–5.11)
RDW: 13.2 % (ref 11.5–15.5)
WBC: 9.5 10*3/uL (ref 4.0–10.5)
nRBC: 0 % (ref 0.0–0.2)

## 2023-02-23 LAB — PREGNANCY, URINE: Preg Test, Ur: NEGATIVE

## 2023-02-23 LAB — URINALYSIS, ROUTINE W REFLEX MICROSCOPIC
Bilirubin Urine: NEGATIVE
Glucose, UA: NEGATIVE mg/dL
Hgb urine dipstick: NEGATIVE
Ketones, ur: 5 mg/dL — AB
Nitrite: NEGATIVE
Protein, ur: NEGATIVE mg/dL
Specific Gravity, Urine: 1.026 (ref 1.005–1.030)
pH: 5 (ref 5.0–8.0)

## 2023-02-23 LAB — TROPONIN I (HIGH SENSITIVITY): Troponin I (High Sensitivity): <2 ng/L (ref <18)

## 2023-02-23 LAB — LIPASE, BLOOD: Lipase: 33 U/L (ref 11–51)

## 2023-02-23 MED ORDER — MECLIZINE HCL 25 MG PO TABS: 25.0000 mg | ORAL_TABLET | Freq: Three times a day (TID) | ORAL | 0 refills | Status: AC | PRN

## 2023-02-23 MED ORDER — MECLIZINE HCL 25 MG PO TABS
50.0000 mg | ORAL_TABLET | Freq: Once | ORAL | Status: DC
  Filled 2023-02-23: qty 2

## 2023-02-23 NOTE — ED Triage Notes (Signed)
Pt sts that she has been having dizziness with nausea and vomiting. Pt sts that it started last night and has not stopped. Pt endorses that her dizziness with nausea get worse when she changes planes or moves her head from side to side.

## 2023-02-23 NOTE — ED Provider Notes (Signed)
[facility] Provider Note  Patient Contact: 5:02 PM (approximate)   History   Dizziness   HPI  Allison [name] is a 22 y.o. female with a history of migraines, asthma and seizures, presents to the emergency department with chest pain and dizziness that started last night.  Patient denies a history of cardiac issues in the past.  Patient called her primary care at [facility] primary care and [place] who referred her to the emergency department for chest pain.  She denies nausea, vomiting or abdominal pain.  She reports that dizziness is provoked by movement but chest pain does not change with exertion.  No associated shortness of breath or chest tightness.  Patient states that pregnancy is a possibility.      Physical Exam   Triage Vital Signs: ED Triage Vitals  Enc Vitals Group     BP 02/23/23 1547 115/70     Pulse Rate 02/23/23 1547 85     Resp 02/23/23 1547 17     Temp 02/23/23 1547 98.1 F (36.7 C)     Temp Source 02/23/23 1547 Oral     SpO2 02/23/23 1547 97 %     Weight 02/23/23 1548 186 lb (84.4 kg)     Height --      Head Circumference --      Peak Flow --      Pain Score 02/23/23 1548 0     Pain Loc --      Pain Edu? --      Excl. in GC? --     Most recent vital signs: Vitals:   02/23/23 1547  BP: 115/70  Pulse: 85  Resp: 17  Temp: 98.1 F (36.7 C)  SpO2: 97%     General: Alert and in no acute distress. Eyes:  PERRL. EOMI. Head: No acute traumatic findings ENT:      Nose: No congestion/rhinnorhea.      Mouth/Throat: Mucous membranes are moist. Neck: No stridor. No cervical spine tenderness to palpation. Cardiovascular:  Good peripheral perfusion Respiratory: Normal respiratory effort without tachypnea or retractions. Lungs CTAB. Good air entry to the bases with no decreased or absent breath sounds. Gastrointestinal: Bowel sounds 4 quadrants. Soft and nontender to palpation. No guarding or rigidity. No palpable masses. No distention.  No CVA tenderness. Musculoskeletal: Full range of motion to all extremities.  Neurologic:  No gross focal neurologic deficits are appreciated.  Skin:   No rash noted    ED Results / Procedures / Treatments   Labs (all labs ordered are listed, but only abnormal results are displayed) Labs Reviewed  URINALYSIS, ROUTINE W REFLEX MICROSCOPIC - Abnormal; Notable for the following components:      Result Value   Color, Urine YELLOW (*)    APPearance CLOUDY (*)    Ketones, ur 5 (*)    Leukocytes,Ua TRACE (*)    Bacteria, UA RARE (*)    All other components within normal limits  CBC WITH DIFFERENTIAL/PLATELET  COMPREHENSIVE METABOLIC PANEL  LIPASE, BLOOD  PREGNANCY, URINE  TROPONIN I (HIGH SENSITIVITY)  TROPONIN I (HIGH SENSITIVITY)     EKG  Normal sinus rhythm without ST segment elevation or other apparent arrhythmia.   RADIOLOGY  I personally viewed and evaluated these images as part of my medical decision making, as well as reviewing the written report by the radiologist.  ED Provider Interpretation: No acute abnormality on chest x-ray   PROCEDURES:  Critical Care performed: No  Procedures  MEDICATIONS ORDERED IN ED: Medications  meclizine (ANTIVERT) tablet 50 mg (50 mg Oral Not Given 02/23/23 1839)     IMPRESSION / MDM / ASSESSMENT AND PLAN / ED COURSE  I reviewed the triage vital signs and the nursing notes.                              Assessment and plan: Chest pain: Dizziness:    22 year old female presents to the emergency department with chest discomfort and dizziness that started last night.  Vital signs are reassuring at triage.  On exam, patient was alert, active and nontoxic-appearing.  CBC, CMP and troponin within range.  Lipase within reference range.  Urinalysis consistent with dirty catch.  EKG indicated normal sinus rhythm without ST segment elevation or other apparent arrhythmia.  Upon recheck, patient was resting comfortably and not  complaining of any chest pain.  She was given some meclizine for her dizziness.  Return precautions were given to return with new or worsening symptoms.  All patient questions were answered.   FINAL CLINICAL IMPRESSION(S) / ED DIAGNOSES   Final diagnoses:  Dizziness  Nonspecific chest pain     Rx / DC Orders   ED Discharge Orders          Ordered    meclizine (ANTIVERT) 25 MG tablet  3 times daily PRN        02/23/23 1801             Note:  This document was prepared using Dragon voice recognition software and may include unintentional dictation errors.   Pia Mau [place], PA-C 02/23/23 2119    Merwyn Katos, MD 02/23/23 [phone]

## 2023-02-23 NOTE — Discharge Instructions (Addendum)
Take Meclizine every eight hours for dizziness.

## 2023-03-20 IMAGING — US US OB COMP LESS 14 WK
1 series · 14 of 28 positions shown · non-contrast
Comparison: None.

CLINICAL DATA: Vaginal bleeding.  Cramping.

EXAM:
OBSTETRIC <14 WK ULTRASOUND
TECHNIQUE: Transabdominal ultrasound was performed for evaluation of the
gestation as well as the maternal uterus and adnexal regions.

[Series 1: us ob comp less 14 wks · 14 of 43 slices shown]
[im 2/43]
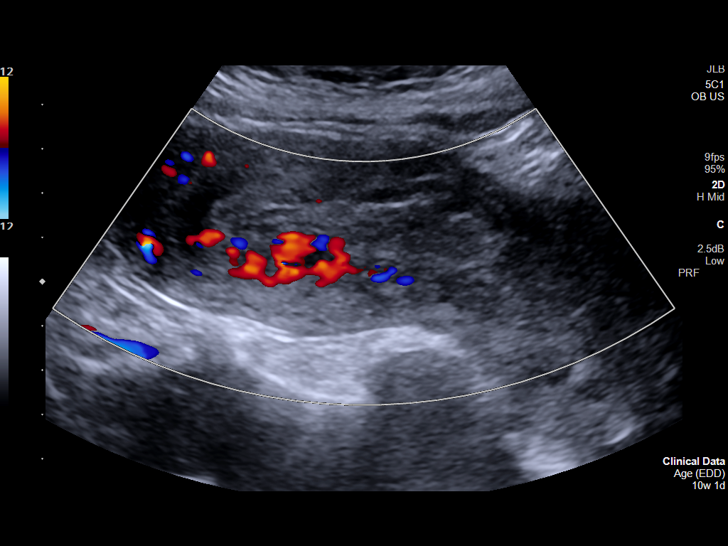
[im 5/43]
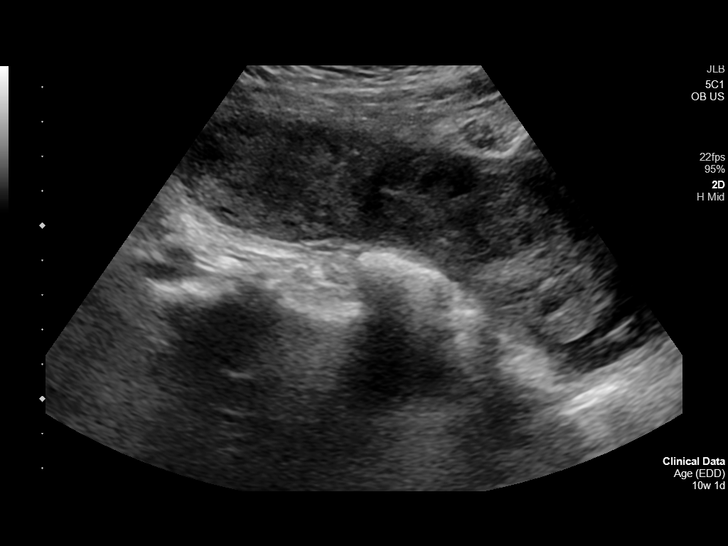
[im 8/43]
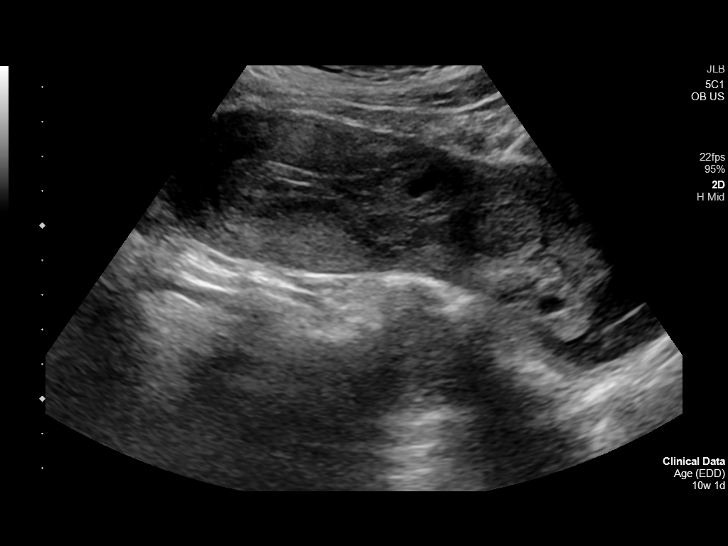
[im 11/43]
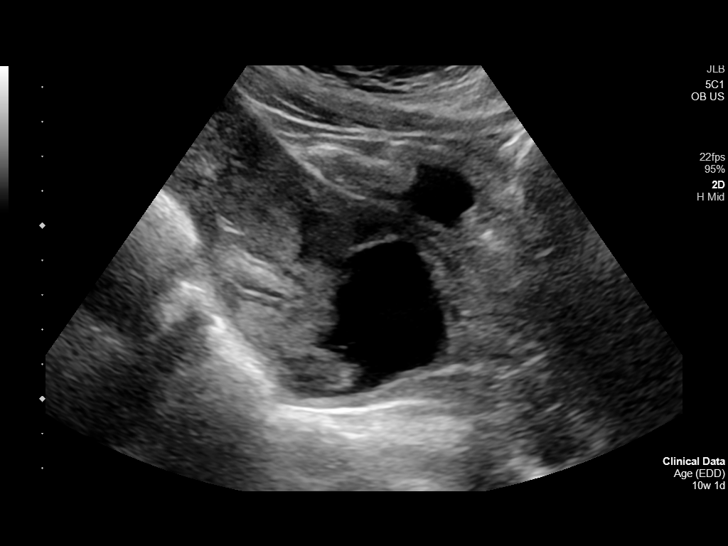
[im 15/43]
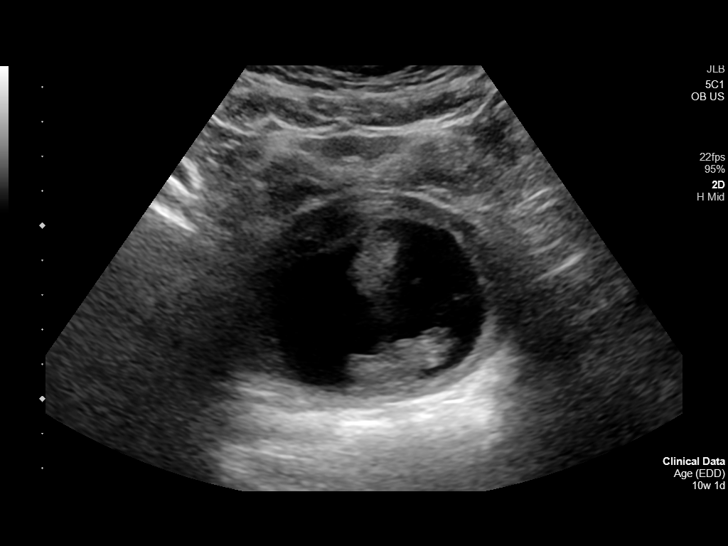
[im 18/43]
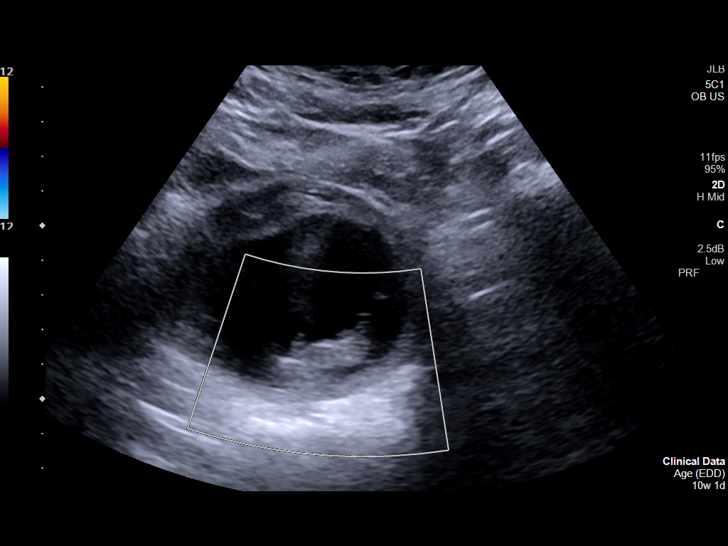
[im 21/43]
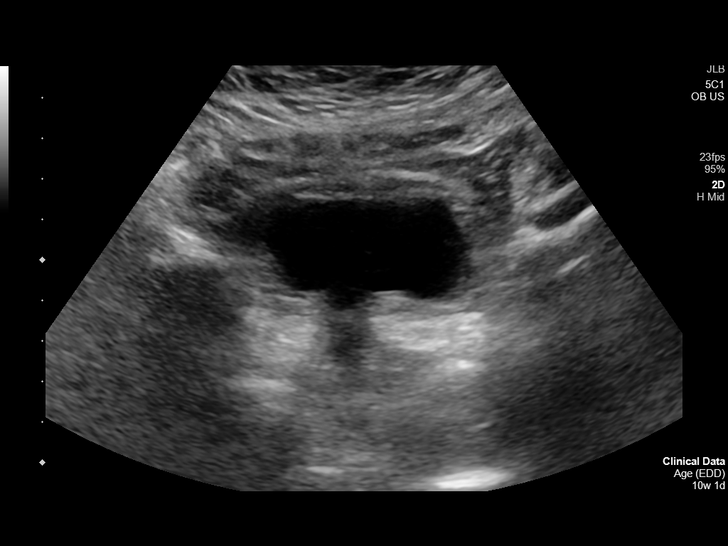
[im 24/43]
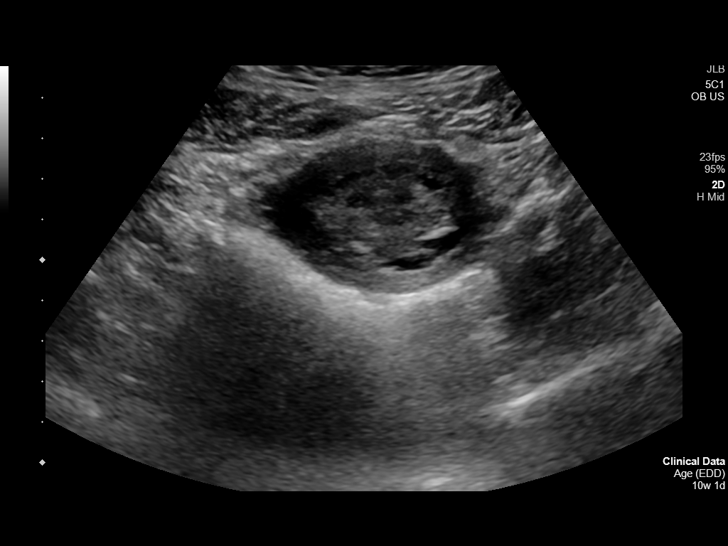
[im 27/43]
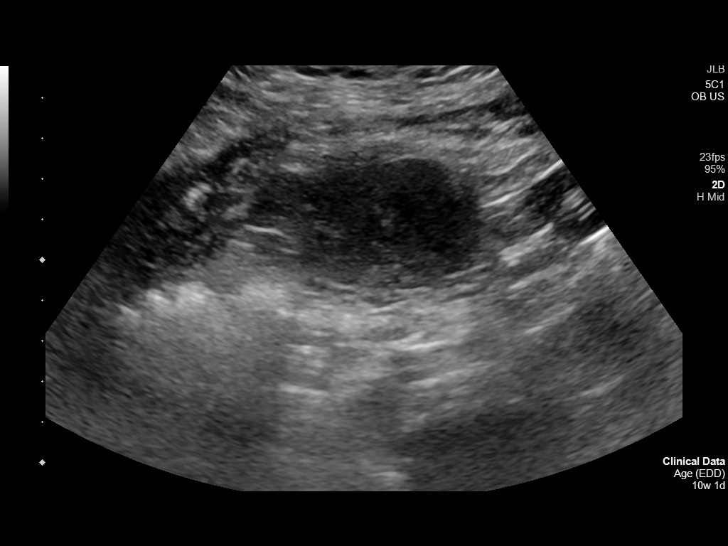
[im 30/43]
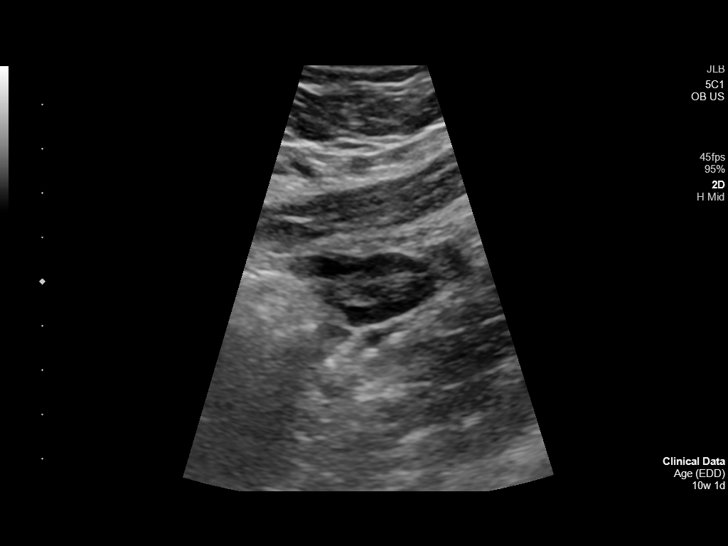
[im 33/43]
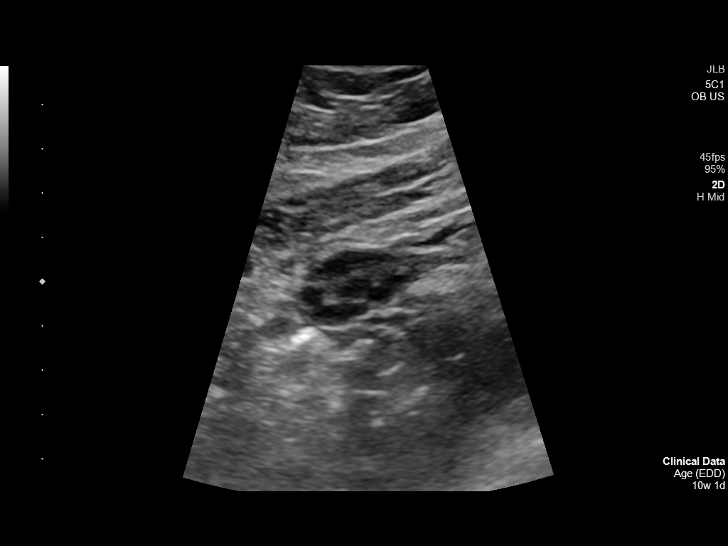
[im 36/43]
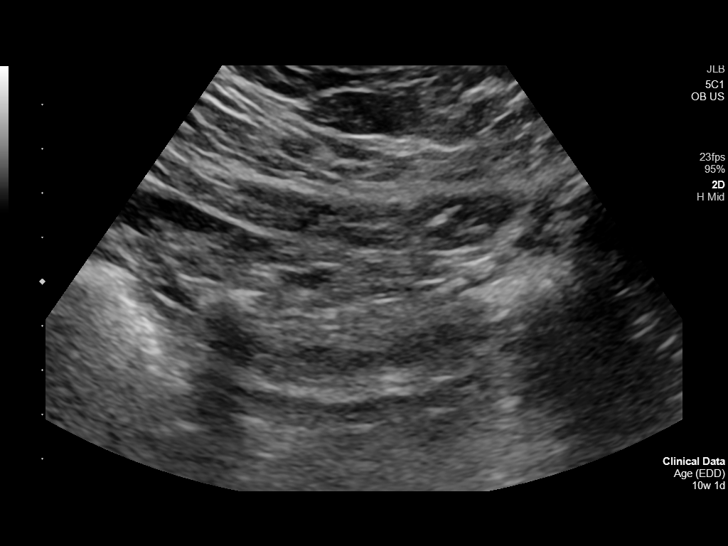
[im 39/43]
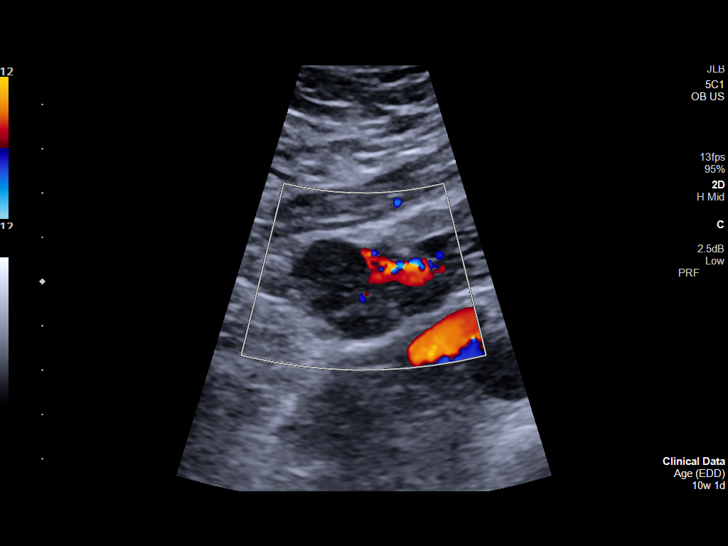
[im 43/43]
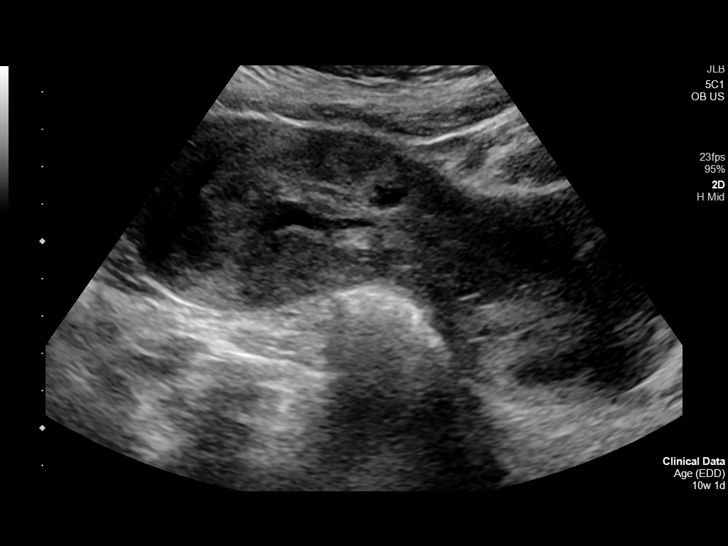

[14 of 28 positions shown; findings below may reference images not displayed]

FINDINGS: Intrauterine gestational sac: There is a gestational sac in the
cervix.

Yolk sac:  Not Visualized.

Embryo:  There is a fetus identified in the cervix.

Cardiac Activity: Not Visualized.

MSD:    mm    w     d

CRL:   32.7 mm   10 w 1 d                  US EDC:

Subchorionic hemorrhage:  None visualized.

Maternal uterus/adnexae: The ovaries are unremarkable.
IMPRESSION: The gestational sac containing a fetus is seen in the cervix
consistent with spontaneous abortion in progress. Thickened
heterogeneous endometrium is identified consistent with history.
Recommend close follow-up and follow-up imaging as clinically
warranted.

## 2023-04-02 ENCOUNTER — Ambulatory Visit (LOCAL_COMMUNITY_HEALTH_CENTER): Payer: Medicaid Other

## 2023-04-02 VITALS — BP 104/69 | Ht 60.0 in | Wt 186.0 lb

## 2023-04-02 DIAGNOSIS — Z309 Encounter for contraceptive management, unspecified: Secondary | ICD-10-CM | POA: Diagnosis not present

## 2023-04-02 DIAGNOSIS — Z3202 Encounter for pregnancy test, result negative: Secondary | ICD-10-CM | POA: Diagnosis not present

## 2023-04-02 LAB — PREGNANCY, URINE: Preg Test, Ur: NEGATIVE

## 2023-04-02 NOTE — Progress Notes (Signed)
UPT negative. Pt explains she is trying to conceive and has been trying for past 11 months. IUD removed 04/2022. LMP 02/08/23 and normally has regular periods. States she has been feeling down lately d/t not becoming pregnant. PHQ = 13. Declines to see provider today d/t time constraints. Accepts contact card for [facility] phone #, and info sheet "How to Find Therapist." States she has support system. Denies thoughts of harming self/others.    Advised to f-u with PCP. Pt states she has f-u scheduled with [facility].  Recommended to return for UPT in 2 weeks if no period, if she desires. Questions answered and reports understanding. Jerel Shepherd, RN

## 2023-04-16 IMAGING — US US PELVIS COMPLETE TRANSABD/TRANSVAG W DUPLEX
1 series · 13 of 25 positions shown · non-contrast
Comparison: Prior ultrasound from 06/28/2021.

CLINICAL DATA: Initial evaluation for recent miscarriage, increased
vaginal bleeding, pain.

EXAM:
TRANSABDOMINAL AND TRANSVAGINAL ULTRASOUND OF PELVIS
DOPPLER ULTRASOUND OF OVARIES
TECHNIQUE: Both transabdominal and transvaginal ultrasound examinations of the
pelvis were performed. Transabdominal technique was performed for
global imaging of the pelvis including uterus, ovaries, adnexal
regions, and pelvic cul-de-sac.
It was necessary to proceed with endovaginal exam following the
transabdominal exam to visualize the uterus, endometrium, and
ovaries. Color and duplex Doppler ultrasound was utilized to
evaluate blood flow to the ovaries.

[Series 1: us pelvic complete w transvaginal and torsion righ · 13 of 111 slices shown]
[im 1/111]
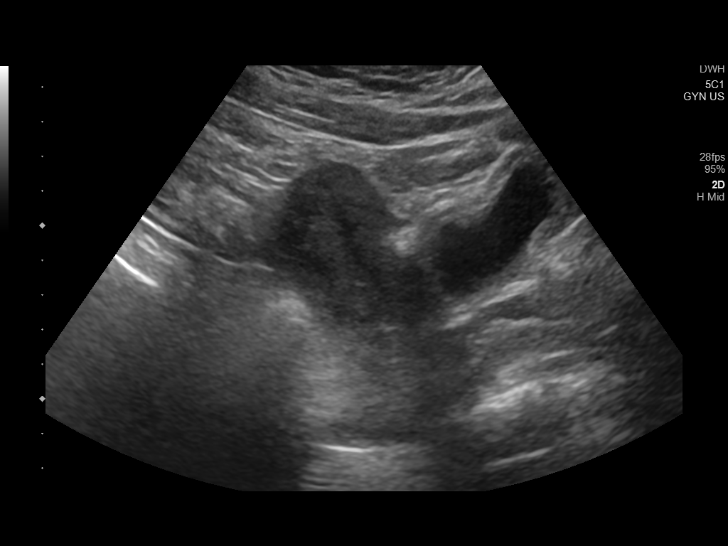
[im 10/111]
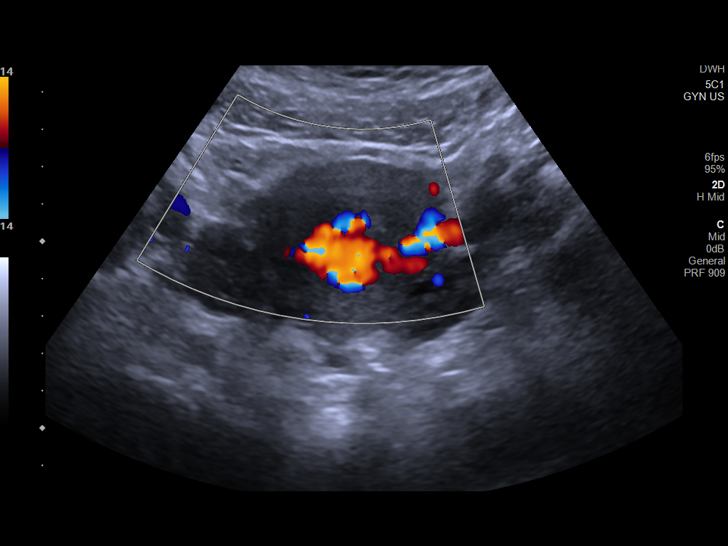
[im 19/111]
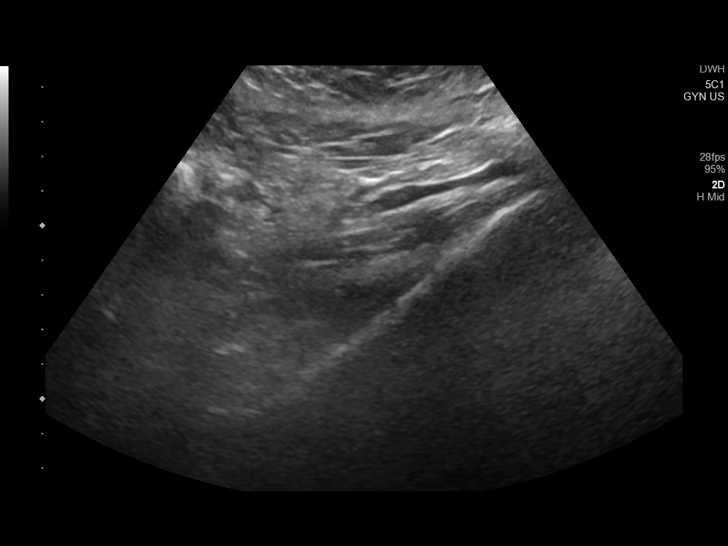
[im 28/111]
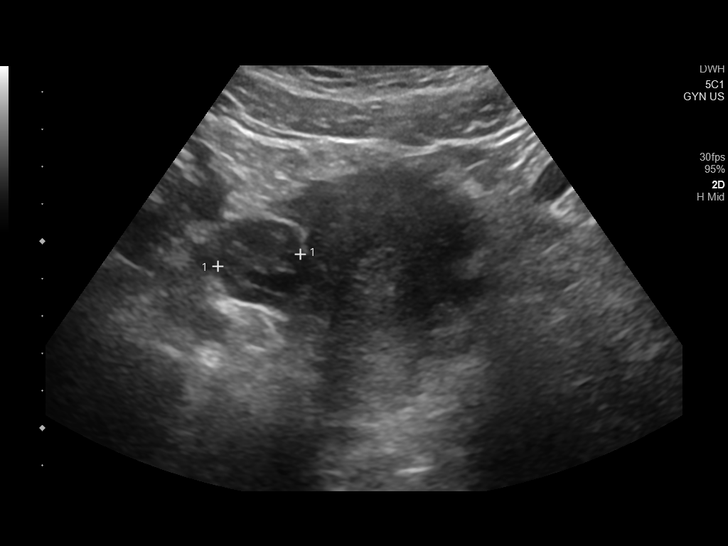
[im 37/111]
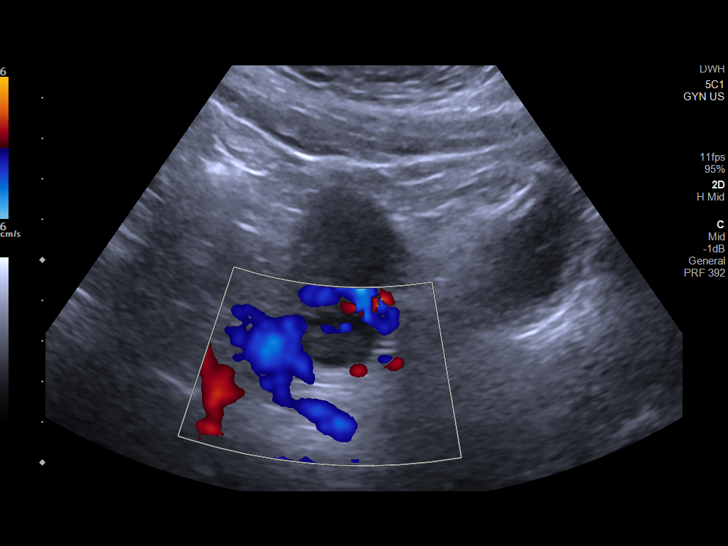
[im 46/111]
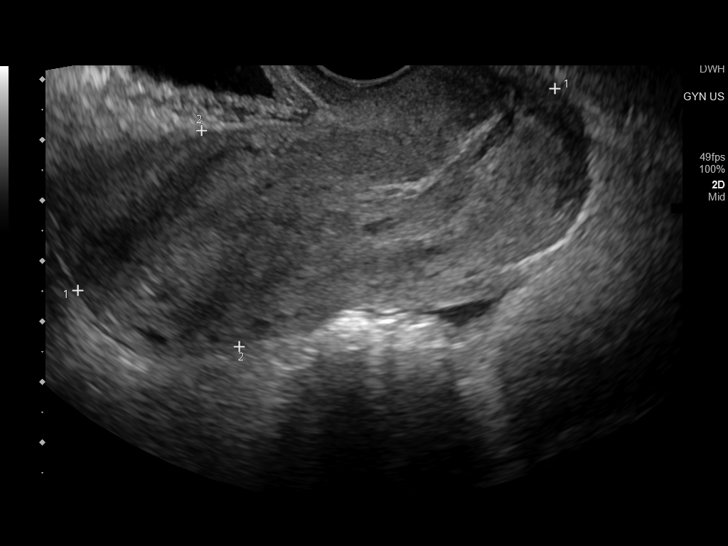
[im 56/111]
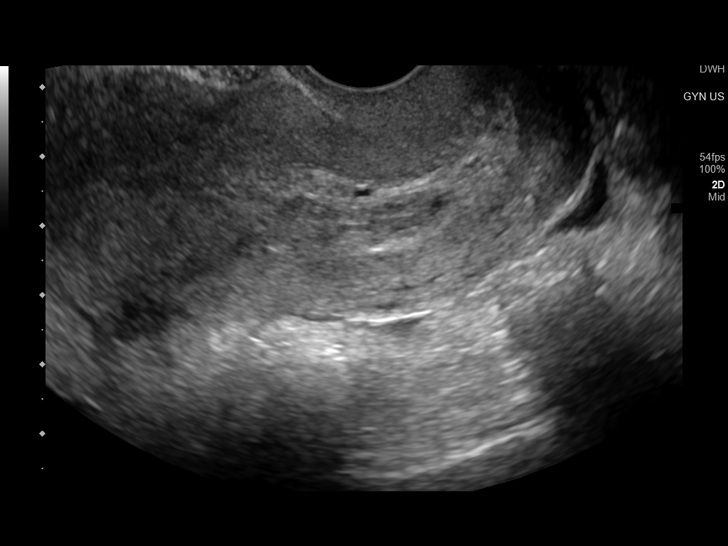
[im 65/111]
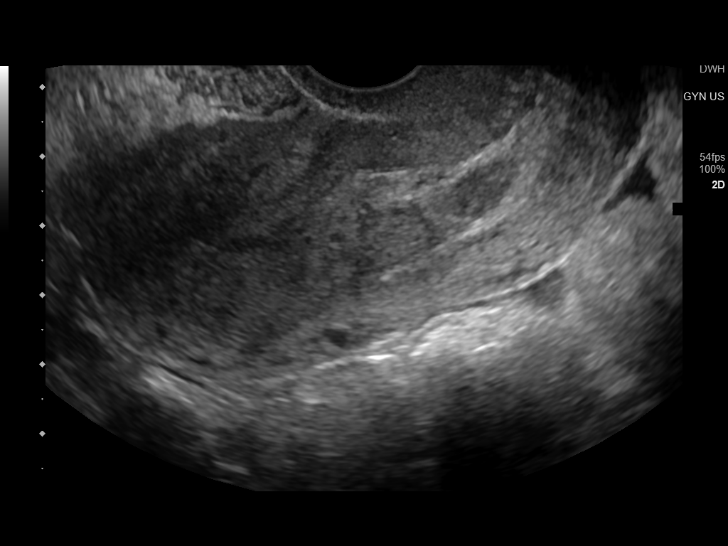
[im 74/111]
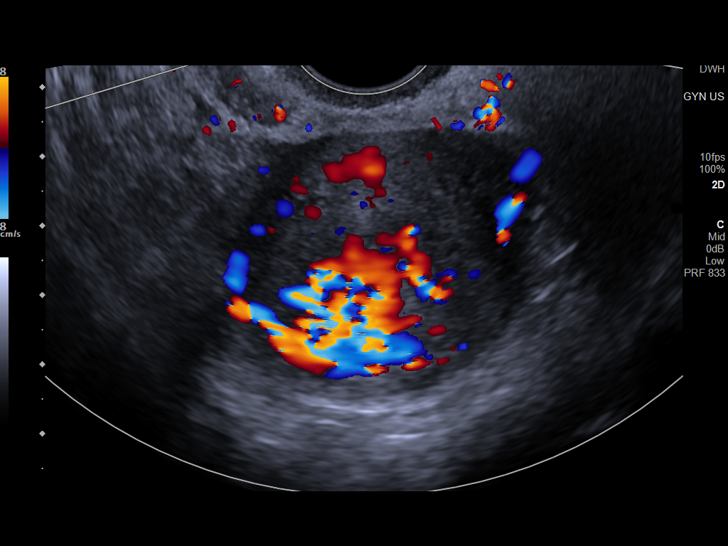
[im 83/111]
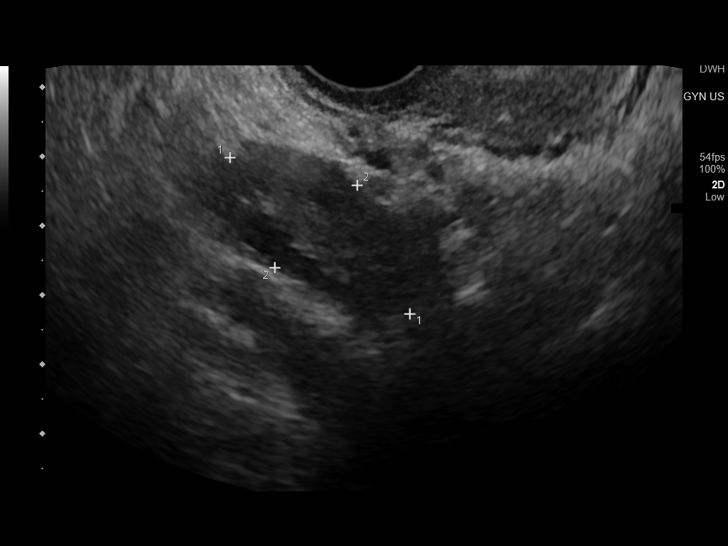
[im 92/111]
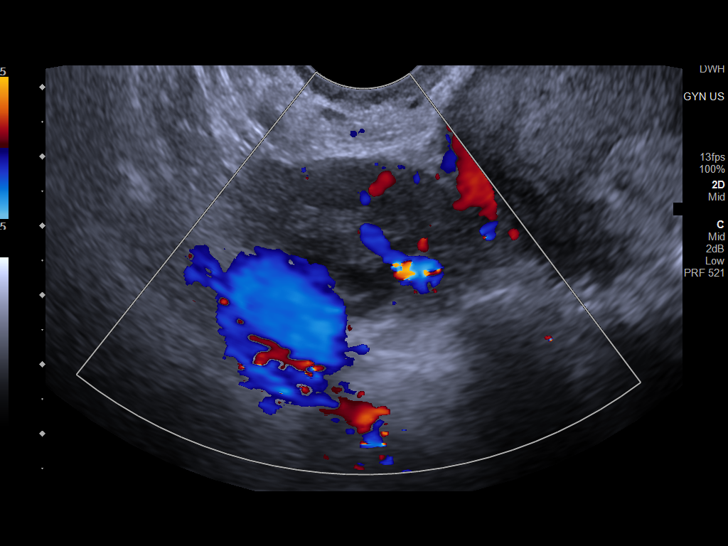
[im 101/111]
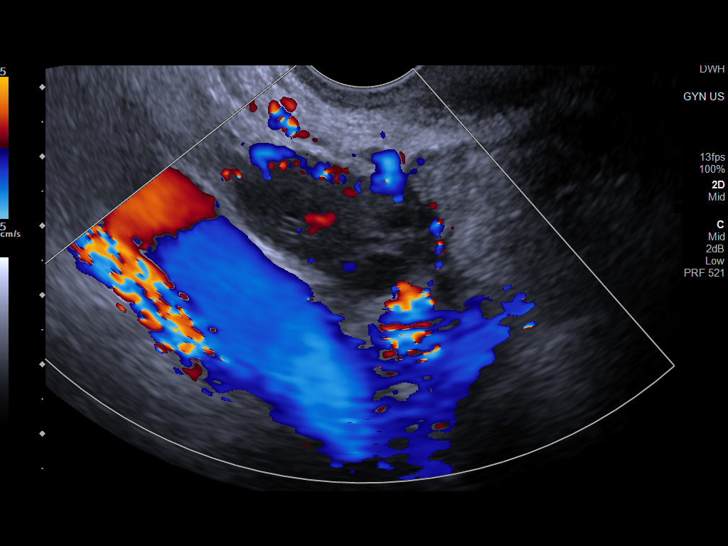
[im 111/111]
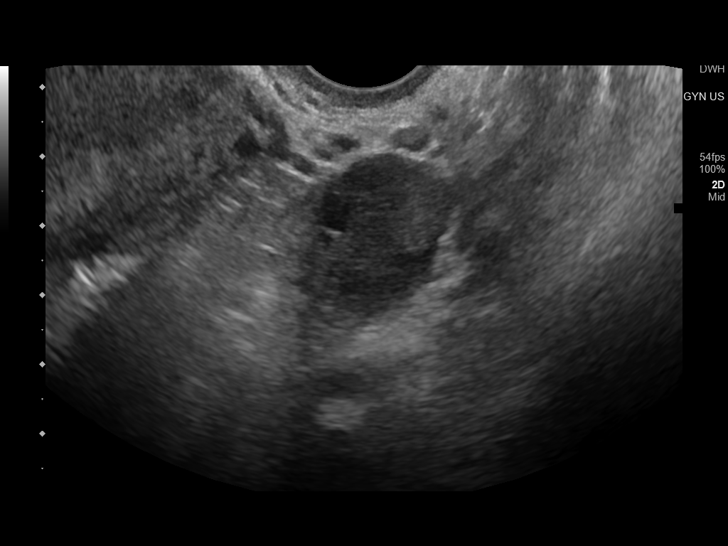

[13 of 25 positions shown; findings below may reference images not displayed]

FINDINGS: Uterus

Measurements: 8.6 x 3.6 x 4.7 cm = volume: 76.0 mL. Uterus is
anteverted. No discrete fibroid or mass.

Endometrium

Thickness: 13.5 mm. Markedly increased vascularity seen throughout
the endometrial complex, concerning for possible retained products
of conception.

Right ovary

Measurements: 3.4 x 1.7 x 2.6 cm = volume: 7.8 mL. Normal
appearance/no adnexal mass.

Left ovary

Measurements: 3.7 x 2.1 x 2.1 cm = volume: 8.6 mL. Normal
appearance/no adnexal mass.

Pulsed Doppler evaluation of both ovaries demonstrates normal
low-resistance arterial and venous waveforms.

Other findings

Trace free fluid seen within the pelvis.
IMPRESSION: 1. Endometrial stripe thickened up to 13.5 mm with markedly
increased vascularity, concerning for retained products of
conception.
2. Otherwise unremarkable and normal pelvic ultrasound. No evidence
for torsion or other acute abnormality.

## 2023-04-22 NOTE — Progress Notes (Signed)
    GYNECOLOGY PROGRESS NOTE  Subjective:    Patient ID: Allison [name], female    DOB: 2000/12/12, 22 y.o.   MRN: [id number]  HPI  Patient is a 22 y.o. G85P0010 female who presents for evaluation of amenorrhea. Her last menstrual period was 04/13/2023 it lasted on 2 days and only spotting. Her last normal cycle was 02/08/2023, and she did not have a cycle at all in April. She had a negative urine HCG at the [facility] on 04/02/2023. Of note, patient has had a history of irregular periods in the past after her miscarriage.   Patient also expresses concern about her risk of ovarian cancer. Notes several family members with either ovarian, cervical, or other cancers and is concerned that her hormonal issues may be related.   The following portions of the patient's history were reviewed and updated as appropriate: allergies, current medications, past family history, past medical history, past social history, past surgical history, and problem list.  Review of Systems Pertinent items are noted in HPI.   Objective:   Blood pressure 99/73, pulse 87, resp. rate 16, height 5\' 2"  (1.575 m), weight 183 lb 11.2 oz (83.3 kg), last menstrual period 04/13/2023.  Body mass index is 33.6 kg/m. General appearance: alert, cooperative, and no distress Remainder of exam deferred.    Labs:  Results for orders placed or performed in visit on 04/27/23  POCT urine pregnancy  Result Value Ref Range   Preg Test, Ur Negative Negative      Assessment:   1. Amenorrhea   2. Family history of cancer      Plan:   1. Amenorrhea - Unclear cause at this time. Labs ordered.  - POCT urine pregnancy - Progesterone - FSH/LH - Estradiol - Can consider progesterone withdrawal, given prescription for Provera.   2. Family history of cancer - Discussed family history, patient reports significant family history (although unclear if certain instances were pre-cancerous or true cancers). Advised on hereditary cancer  screening option. Patient notes she would like to have testing performed.  Will order [employer] today. Advised testing can take an average of 2 weeks.    A total of 15 minutes were spent face-to-face with the patient during this encounter and over half of that time dealt with counseling and coordination of care.   Hildred Laser, MD [facility]

## 2023-04-27 ENCOUNTER — Encounter: Payer: Self-pay | Admitting: Obstetrics and Gynecology

## 2023-04-27 ENCOUNTER — Ambulatory Visit (INDEPENDENT_AMBULATORY_CARE_PROVIDER_SITE_OTHER): Payer: Medicaid Other | Admitting: Obstetrics and Gynecology

## 2023-04-27 VITALS — BP 99/73 | HR 87 | Resp 16 | Ht 62.0 in | Wt 183.7 lb

## 2023-04-27 DIAGNOSIS — Z809 Family history of malignant neoplasm, unspecified: Secondary | ICD-10-CM

## 2023-04-27 DIAGNOSIS — Z3202 Encounter for pregnancy test, result negative: Secondary | ICD-10-CM | POA: Diagnosis not present

## 2023-04-27 DIAGNOSIS — N912 Amenorrhea, unspecified: Secondary | ICD-10-CM | POA: Diagnosis not present

## 2023-04-27 LAB — POCT URINE PREGNANCY: Preg Test, Ur: NEGATIVE

## 2023-04-27 MED ORDER — MEDROXYPROGESTERONE ACETATE 10 MG PO TABS: 10.0000 mg | ORAL_TABLET | Freq: Every day | ORAL | 2 refills | Status: DC

## 2023-05-07 ENCOUNTER — Encounter: Payer: Self-pay | Admitting: Obstetrics and Gynecology

## 2023-05-13 ENCOUNTER — Ambulatory Visit (LOCAL_COMMUNITY_HEALTH_CENTER): Payer: Medicaid Other

## 2023-05-13 VITALS — BP 127/82 | Ht 61.0 in | Wt 186.0 lb

## 2023-05-13 DIAGNOSIS — Z3201 Encounter for pregnancy test, result positive: Secondary | ICD-10-CM

## 2023-05-13 DIAGNOSIS — Z309 Encounter for contraceptive management, unspecified: Secondary | ICD-10-CM

## 2023-05-13 LAB — PREGNANCY, URINE: Preg Test, Ur: POSITIVE — AB

## 2023-05-13 MED ORDER — PRENATAL VITAMINS 28-0.8 MG PO TABS: 28.0000 mg | ORAL_TABLET | Freq: Every day | ORAL | 0 refills | Status: AC

## 2023-05-13 NOTE — Progress Notes (Signed)
UPT positive.  Plans care at [facility].  Positive pregnancy packet given and reviewed with pt.  See pregnancy flowsheet.  The patient was dispensed prenatal vitamins today. I provided counseling today regarding the medication.  Patient given the opportunity to ask questions. Questions answered.    Cherlynn Polo, RN

## 2023-05-14 ENCOUNTER — Ambulatory Visit (INDEPENDENT_AMBULATORY_CARE_PROVIDER_SITE_OTHER): Payer: Medicaid Other

## 2023-05-14 VITALS — Wt 186.0 lb

## 2023-05-14 DIAGNOSIS — Z3689 Encounter for other specified antenatal screening: Secondary | ICD-10-CM

## 2023-05-14 DIAGNOSIS — Z369 Encounter for antenatal screening, unspecified: Secondary | ICD-10-CM

## 2023-05-14 DIAGNOSIS — Z3480 Encounter for supervision of other normal pregnancy, unspecified trimester: Secondary | ICD-10-CM

## 2023-05-14 NOTE — Progress Notes (Signed)
New OB Intake  I connected with  Allison [name] on 05/14/23 at  3:15 PM EDT by telephone and verified that I am speaking with the correct person using two identifiers. Nurse is located at [facility] and pt is located in care with her sister headed home.  I explained I am completing New OB Intake today. We discussed her EDD of 11/19/2023 that is based on LMP of 02/12/2023. Pt is G2/P0010. I reviewed her allergies, medications, Medical/Surgical/OB history, and appropriate screenings. There are cats in the home.  Indoor. The FOB and pt's sister changes the litter box.  Based on history, this is a/an pregnancy uncomplicated .   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  Delivery Plans:  Plans to deliver at [facility].  Anatomy US Explained first scheduled [place] will be scheduled soon (pt has  number) and an anatomy scan will be done at 20 weeks.  Labs Discussed genetic screening with patient. Patient desires genetic testing to be drawn at new OB visit. Discussed possible labs to be drawn at new OB appointment.  COVID Vaccine Patient has had COVID vaccine.   Social Determinants of Health Food Insecurity: denies food insecurity Transportation: Patient denies transportation needs.  First visit review I reviewed new OB appt with pt. I explained she will have ob bloodwork and pap smear/pelvic exam if indicated. Explained pt will be seen by an AOB provider at first visit; encounter routed to appropriate provider.   Loran Senters, [facility] 05/14/2023  3:54 PM

## 2023-05-14 NOTE — Patient Instructions (Signed)
Second Trimester of Pregnancy  The second trimester of pregnancy is from week 13 through week 27. This is months 4 through 6 of pregnancy. The second trimester is often a time when you feel your best. Your body has adjusted to being pregnant, and you begin to feel better physically. During the second trimester: Morning sickness has lessened or stopped completely. You may have more energy. You may have an increase in appetite. The second trimester is also a time when the unborn baby (fetus) is growing rapidly. At the end of the sixth month, the fetus may be up to 12 inches long and weigh about 1 pounds. You will likely begin to feel the baby move (quickening) between 16 and 20 weeks of pregnancy. Body changes during your second trimester Your body continues to go through many changes during your second trimester. The changes vary and generally return to normal after the baby is born. Physical changes Your weight will continue to increase. You will notice your lower abdomen bulging out. You may begin to get stretch marks on your hips, abdomen, and breasts. Your breasts will continue to grow and to become tender. Dark spots or blotches (chloasma or mask of pregnancy) may develop on your face. A dark line from your belly button to the pubic area (linea nigra) may appear. You may have changes in your hair. These can include thickening of your hair, rapid growth, and changes in texture. Some people also have hair loss during or after pregnancy, or hair that feels dry or thin. Health changes You may develop headaches. You may have heartburn. You may develop constipation. You may develop hemorrhoids or swollen, bulging veins (varicose veins). Your gums may bleed and may be sensitive to brushing and flossing. You may urinate more often because the fetus is pressing on your bladder. You may have back pain. This is caused by: Weight gain. Pregnancy hormones that are relaxing the joints in your  pelvis. A shift in weight and the muscles that support your balance. Follow these instructions at home: Medicines Follow your health care provider's instructions regarding medicine use. Specific medicines may be either safe or unsafe to take during pregnancy. Do not take any medicines unless approved by your health care provider. Take a prenatal vitamin that contains at least 600 micrograms (mcg) of folic acid. Eating and drinking Eat a healthy diet that includes fresh fruits and vegetables, whole grains, good sources of protein such as meat, eggs, or tofu, and low-fat dairy products. Avoid raw meat and unpasteurized juice, milk, and cheese. These carry germs that can harm you and your baby. You may need to take these actions to prevent or treat constipation: Drink enough fluid to keep your urine pale yellow. Eat foods that are high in fiber, such as beans, whole grains, and fresh fruits and vegetables. Limit foods that are high in fat and processed sugars, such as fried or sweet foods. Activity Exercise only as directed by your health care provider. Most people can continue their usual exercise routine during pregnancy. Try to exercise for 30 minutes at least 5 days a week. Stop exercising if you develop contractions in your uterus. Stop exercising if you develop pain or cramping in the lower abdomen or lower back. Avoid exercising if it is very hot or humid or if you are at a high altitude. Avoid heavy lifting. If you choose to, you may have sex unless your health care provider tells you not to. Relieving pain and discomfort Wear a supportive   bra to prevent discomfort from breast tenderness. Take warm sitz baths to soothe any pain or discomfort caused by hemorrhoids. Use hemorrhoid cream if your health care provider approves. Rest with your legs raised (elevated) if you have leg cramps or low back pain. If you develop varicose veins: Wear support hose as told by your health care  provider. Elevate your feet for 15 minutes, 3-4 times a day. Limit salt in your diet. Safety Wear your seat belt at all times when driving or riding in a car. Talk with your health care provider if someone is verbally or physically abusive to you. Lifestyle Do not use hot tubs, steam rooms, or saunas. Do not douche. Do not use tampons or scented sanitary pads. Avoid cat litter boxes and soil used by cats. These carry germs that can cause birth defects in the baby and possibly loss of the fetus by miscarriage or stillbirth. Do not use herbal remedies, alcohol, illegal drugs, or medicines that are not approved by your health care provider. Chemicals in these products can harm your baby. Do not use any products that contain nicotine or tobacco, such as cigarettes, e-cigarettes, and chewing tobacco. If you need help quitting, ask your health care provider. General instructions During a routine prenatal visit, your health care provider will do a physical exam and other tests. He or she will also discuss your overall health. Keep all follow-up visits. This is important. Ask your health care provider for a referral to a local prenatal education class. Ask for help if you have counseling or nutritional needs during pregnancy. Your health care provider can offer advice or refer you to specialists for help with various needs. Where to find more information American Pregnancy Association: americanpregnancy.org American College of Obstetricians and Gynecologists: acog.org/en/Womens%20Health/Pregnancy Office on Women's Health: womenshealth.gov/pregnancy Contact a health care provider if you have: A headache that does not go away when you take medicine. Vision changes or you see spots in front of your eyes. Mild pelvic cramps, pelvic pressure, or nagging pain in the abdominal area. Persistent nausea, vomiting, or diarrhea. A bad-smelling vaginal discharge or foul-smelling urine. Pain when you  urinate. Sudden or extreme swelling of your face, hands, ankles, feet, or legs. A fever. Get help right away if you: Have fluid leaking from your vagina. Have spotting or bleeding from your vagina. Have severe abdominal cramping or pain. Have difficulty breathing. Have chest pain. Have fainting spells. Have not felt your baby move for the time period told by your health care provider. Have new or increased pain, swelling, or redness in an arm or leg. Summary The second trimester of pregnancy is from week 13 through week 27 (months 4 through 6). Do not use herbal remedies, alcohol, illegal drugs, or medicines that are not approved by your health care provider. Chemicals in these products can harm your baby. Exercise only as directed by your health care provider. Most people can continue their usual exercise routine during pregnancy. Keep all follow-up visits. This is important. This information is not intended to replace advice given to you by your health care provider. Make sure you discuss any questions you have with your health care provider. Document Revised: 04/10/2020 Document Reviewed: 02/15/2020 Elsevier Patient Education  2024 [employer] First Trimester of Pregnancy  The first trimester of pregnancy starts on the first day of your last menstrual period until the end of week 12. This is also called months 1 through 3 of pregnancy. Body changes during your first trimester Your   body goes through many changes during pregnancy. The changes usually return to normal after your baby is born. Physical changes You may gain or lose weight. Your breasts may grow larger and hurt. The area around your nipples may get darker. Dark spots or blotches may develop on your face. You may have changes in your hair. Health changes You may feel like you might vomit (nauseous), and you may vomit. You may have heartburn. You may have headaches. You may have trouble pooping (constipation). Your  gums may bleed. Other changes You may get tired easily. You may pee (urinate) more often. Your menstrual periods will stop. You may not feel hungry. You may want to eat certain kinds of food. You may have changes in your emotions from day to day. You may have more dreams. Follow these instructions at home: Medicines Take over-the-counter and prescription medicines only as told by your doctor. Some medicines are not safe during pregnancy. Take a prenatal vitamin that contains at least 600 micrograms (mcg) of folic acid. Eating and drinking Eat healthy meals that include: Fresh fruits and vegetables. Whole grains. Good sources of protein, such as meat, eggs, or tofu. Low-fat dairy products. Avoid raw meat and unpasteurized juice, milk, and cheese. If you feel like you may vomit, or you vomit: Eat 4 or 5 small meals a day instead of 3 large meals. Try eating a few soda crackers. Drink liquids between meals instead of during meals. You may need to take these actions to prevent or treat trouble pooping: Drink enough fluids to keep your pee (urine) pale yellow. Eat foods that are high in fiber. These include beans, whole grains, and fresh fruits and vegetables. Limit foods that are high in fat and sugar. These include fried or sweet foods. Activity Exercise only as told by your doctor. Most people can do their usual exercise routine during pregnancy. Stop exercising if you have cramps or pain in your lower belly (abdomen) or low back. Do not exercise if it is too hot or too humid, or if you are in a place of great height (high altitude). Avoid heavy lifting. If you choose to, you may have sex unless your doctor tells you not to. Relieving pain and discomfort Wear a good support bra if your breasts are sore. Rest with your legs raised (elevated) if you have leg cramps or low back pain. If you have bulging veins (varicose veins) in your legs: Wear support hose as told by your  doctor. Raise your feet for 15 minutes, 3-4 times a day. Limit salt in your food. Safety Wear your seat belt at all times when you are in a car. Talk with your doctor if someone is hurting you or yelling at you. Talk with your doctor if you are feeling sad or have thoughts of hurting yourself. Lifestyle Do not use hot tubs, steam rooms, or saunas. Do not douche. Do not use tampons or scented sanitary pads. Do not use herbal medicines, illegal drugs, or medicines that are not approved by your doctor. Do not drink alcohol. Do not smoke or use any products that contain nicotine or tobacco. If you need help quitting, ask your doctor. Avoid cat litter boxes and soil that is used by cats. These carry germs that can cause harm to the baby and can cause a loss of your baby by miscarriage or stillbirth. General instructions Keep all follow-up visits. This is important. Ask for help if you need counseling or if you need help with   nutrition. Your doctor can give you advice or tell you where to go for help. Visit your dentist. At home, brush your teeth with a soft toothbrush. Floss gently. Write down your questions. Take them to your prenatal visits. Where to find more information American Pregnancy Association: americanpregnancy.org American College of Obstetricians and Gynecologists: www.acog.org Office on Women's Health: womenshealth.gov/pregnancy Contact a doctor if: You are dizzy. You have a fever. You have mild cramps or pressure in your lower belly. You have a nagging pain in your belly area. You continue to feel like you may vomit, you vomit, or you have watery poop (diarrhea) for 24 hours or longer. You have a bad-smelling fluid coming from your vagina. You have pain when you pee. You are exposed to a disease that spreads from person to person, such as chickenpox, measles, Zika virus, HIV, or hepatitis. Get help right away if: You have spotting or bleeding from your vagina. You have  very bad belly cramping or pain. You have shortness of breath or chest pain. You have any kind of injury, such as from a fall or a car crash. You have new or increased pain, swelling, or redness in an arm or leg. Summary The first trimester of pregnancy starts on the first day of your last menstrual period until the end of week 12 (months 1 through 3). Eat 4 or 5 small meals a day instead of 3 large meals. Do not smoke or use any products that contain nicotine or tobacco. If you need help quitting, ask your doctor. Keep all follow-up visits. This information is not intended to replace advice given to you by your health care provider. Make sure you discuss any questions you have with your health care provider. Document Revised: 04/10/2020 Document Reviewed: 02/15/2020 Elsevier Patient Education  2024 [employer] Inc. Commonly Asked Questions During Pregnancy  Cats: A parasite can be excreted in cat feces.  To avoid exposure you need to have another person empty the little box.  If you must empty the litter box you will need to wear gloves.  Wash your hands after handling your cat.  This parasite can also be found in raw or undercooked meat so this should also be avoided.  Colds, Sore Throats, Flu: Please check your medication sheet to see what you can take for symptoms.  If your symptoms are unrelieved by these medications please call the office.  Dental Work: Most any dental work your dentist recommends is permitted.  X-rays should only be taken during the first trimester if absolutely necessary.  Your abdomen should be shielded with a lead apron during all x-rays.  Please notify your provider prior to receiving any x-rays.  Novocaine is fine; gas is not recommended.  If your dentist requires a note from us prior to dental work please call the office and we will provide one for you.  Exercise: Exercise is an important part of staying healthy during your pregnancy.  You may continue most exercises  you were accustomed to prior to pregnancy.  Later in your pregnancy you will most likely notice you have difficulty with activities requiring balance like riding a bicycle.  It is important that you listen to your body and avoid activities that put you at a higher risk of falling.  Adequate rest and staying well hydrated are a must!  If you have questions about the safety of specific activities ask your provider.    Exposure to Children with illness: Try to avoid obvious exposure; report any   symptoms to us when noted,  If you have chicken pos, red measles or mumps, you should be immune to these diseases.   Please do not take any vaccines while pregnant unless you have checked with your OB provider.  Fetal Movement: After 28 weeks we recommend you do "kick counts" twice daily.  Lie or sit down in a calm quiet environment and count your baby movements "kicks".  You should feel your baby at least 10 times per hour.  If you have not felt 10 kicks within the first hour get up, walk around and have something sweet to eat or drink then repeat for an additional hour.  If count remains less than 10 per hour notify your provider.  Fumigating: Follow your pest control agent's advice as to how long to stay out of your home.  Ventilate the area well before re-entering.  Hemorrhoids:   Most over-the-counter preparations can be used during pregnancy.  Check your medication to see what is safe to use.  It is important to use a stool softener or fiber in your diet and to drink lots of liquids.  If hemorrhoids seem to be getting worse please call the office.   Hot Tubs:  Hot tubs Jacuzzis and saunas are not recommended while pregnant.  These increase your internal body temperature and should be avoided.  Intercourse:  Sexual intercourse is safe during pregnancy as long as you are comfortable, unless otherwise advised by your provider.  Spotting may occur after intercourse; report any bright red bleeding that is heavier  than spotting.  Labor:  If you know that you are in labor, please go to the hospital.  If you are unsure, please call the office and let us help you decide what to do.  Lifting, straining, etc:  If your job requires heavy lifting or straining please check with your provider for any limitations.  Generally, you should not lift items heavier than that you can lift simply with your hands and arms (no back muscles)  Painting:  Paint fumes do not harm your pregnancy, but may make you ill and should be avoided if possible.  Latex or water based paints have less odor than oils.  Use adequate ventilation while painting.  Permanents & Hair Color:  Chemicals in hair dyes are not recommended as they cause increase hair dryness which can increase hair loss during pregnancy.  " Highlighting" and permanents are allowed.  Dye may be absorbed differently and permanents may not hold as well during pregnancy.  Sunbathing:  Use a sunscreen, as skin burns easily during pregnancy.  Drink plenty of fluids; avoid over heating.  Tanning Beds:  Because their possible side effects are still unknown, tanning beds are not recommended.  Ultrasound Scans:  Routine ultrasounds are performed at approximately 20 weeks.  You will be able to see your baby's general anatomy an if you would like to know the gender this can usually be determined as well.  If it is questionable when you conceived you may also receive an ultrasound early in your pregnancy for dating purposes.  Otherwise ultrasound exams are not routinely performed unless there is a medical necessity.  Although you can request a scan we ask that you pay for it when conducted because insurance does not cover " patient request" scans.  Work: If your pregnancy proceeds without complications you may work until your due date, unless your physician or employer advises otherwise.  Round Ligament Pain/Pelvic Discomfort:  Sharp, shooting pains not associated   with bleeding are  fairly common, usually occurring in the second trimester of pregnancy.  They tend to be worse when standing up or when you remain standing for long periods of time.  These are the result of pressure of certain pelvic ligaments called "round ligaments".  Rest, Tylenol and heat seem to be the most effective relief.  As the womb and fetus grow, they rise out of the pelvis and the discomfort improves.  Please notify the office if your pain seems different than that described.  It may represent a more serious condition.  Common Medications Safe in Pregnancy  Acne:      Constipation:  Benzoyl Peroxide     Colace  Clindamycin      Dulcolax Suppository  Topica Erythromycin     Fibercon  Salicylic Acid      Metamucil         Miralax AVOID:        Senakot   Accutane    Cough:  Retin-A       Cough Drops  Tetracycline      Phenergan w/ Codeine if Rx  Minocycline      Robitussin (Plain & DM)  Antibiotics:     Crabs/Lice:  Ceclor       RID  Cephalosporins    AVOID:  E-Mycins      Kwell  Keflex  Macrobid/Macrodantin   Diarrhea:  Penicillin      Kao-Pectate  Zithromax      Imodium AD         PUSH FLUIDS AVOID:       Cipro     Fever:  Tetracycline      Tylenol (Regular or Extra  Minocycline       Strength)  Levaquin      Extra Strength-Do not          Exceed 8 tabs/24 hrs Caffeine:        <200mg/day (equiv. To 1 cup of coffee or  approx. 3 12 oz sodas)         Gas: Cold/Hayfever:       Gas-X  Benadryl      Mylicon  Claritin       Phazyme  **Claritin-D        Chlor-Trimeton    Headaches:  Dimetapp      ASA-Free Excedrin  Drixoral-Non-Drowsy     Cold Compress  Mucinex (Guaifenasin)     Tylenol (Regular or Extra  Sudafed/Sudafed-12 Hour     Strength)  **Sudafed PE Pseudoephedrine   Tylenol Cold & Sinus     Vicks Vapor Rub  Zyrtec  **AVOID if Problems With Blood Pressure         Heartburn: Avoid lying down for at least 1 hour after meals  Aciphex      Maalox     Rash:  Milk of  Magnesia     Benadryl    Mylanta       1% Hydrocortisone Cream  Pepcid  Pepcid Complete   Sleep Aids:  Prevacid      Ambien   Prilosec       Benadryl  Rolaids       Chamomile Tea  Tums (Limit 4/day)     Unisom         Tylenol PM         Warm milk-add vanilla or  Hemorrhoids:       Sugar for taste  Anusol/Anusol H.C.  (RX: Analapram 2.5%)  Sugar Substitutes:    Hydrocortisone OTC     Ok in moderation  Preparation H      Tucks        Vaseline lotion applied to tissue with wiping    Herpes:     Throat:  Acyclovir      Oragel  Famvir  Valtrex     Vaccines:         Flu Shot Leg Cramps:       *Gardasil  Benadryl      Hepatitis A         Hepatitis B Nasal Spray:       Pneumovax  Saline Nasal Spray     Polio Booster         Tetanus Nausea:       Tuberculosis test or PPD  Vitamin B6 25 mg TID   AVOID:    Dramamine      *Gardasil  Emetrol       Live Poliovirus  Ginger Root 250 mg QID    MMR (measles, mumps &  High Complex Carbs @ Bedtime    rebella)  Sea Bands-Accupressure    Varicella (Chickenpox)  Unisom 1/2 tab TID     *No known complications           If received before Pain:         Known pregnancy;   Darvocet       Resume series after  Lortab        Delivery  Percocet    Yeast:   Tramadol      Femstat  Tylenol 3      Gyne-lotrimin  Ultram       Monistat  Vicodin           MISC:         All Sunscreens           Hair Coloring/highlights          Insect Repellant's          (Including DEET)         Mystic Tans  

## 2023-05-18 ENCOUNTER — Ambulatory Visit
Admission: RE | Admit: 2023-05-18 | Discharge: 2023-05-18 | Disposition: A | Discharge status: Home / Self Care | Payer: Medicaid Other | Source: Ambulatory Visit | Attending: Obstetrics | Admitting: Obstetrics

## 2023-05-18 DIAGNOSIS — Z3A01 Less than 8 weeks gestation of pregnancy: Secondary | ICD-10-CM | POA: Diagnosis not present

## 2023-05-18 DIAGNOSIS — Z3480 Encounter for supervision of other normal pregnancy, unspecified trimester: Secondary | ICD-10-CM | POA: Insufficient documentation

## 2023-05-18 DIAGNOSIS — Z3481 Encounter for supervision of other normal pregnancy, first trimester: Secondary | ICD-10-CM | POA: Diagnosis not present

## 2023-05-18 DIAGNOSIS — Z369 Encounter for antenatal screening, unspecified: Secondary | ICD-10-CM | POA: Diagnosis not present

## 2023-05-18 DIAGNOSIS — Z3687 Encounter for antenatal screening for uncertain dates: Secondary | ICD-10-CM | POA: Diagnosis not present

## 2023-05-20 ENCOUNTER — Other Ambulatory Visit: Payer: Self-pay | Admitting: Obstetrics

## 2023-05-20 DIAGNOSIS — O3680X0 Pregnancy with inconclusive fetal viability, not applicable or unspecified: Secondary | ICD-10-CM

## 2023-05-20 NOTE — Progress Notes (Signed)
Spoke to [place] regarding [place] from 05/18/23. By dates, she is [redacted]w[redacted]d. US shows 5w1 days with gestational sac only. Discussed very early pregnancy vs possible SAB. Will repeat scan in 1-2 weeks for viability. Order placed.  Glenetta Borg, CNM

## 2023-05-21 ENCOUNTER — Telehealth: Payer: Self-pay | Admitting: Obstetrics

## 2023-05-21 ENCOUNTER — Other Ambulatory Visit: Payer: Self-pay | Admitting: Certified Nurse Midwife

## 2023-05-21 DIAGNOSIS — O3680X0 Pregnancy with inconclusive fetal viability, not applicable or unspecified: Secondary | ICD-10-CM

## 2023-05-21 NOTE — Telephone Encounter (Signed)
I contacted via phone, no answer, voicemail is not set up. The patient is needing an follow up Viability scan per MS next week. She is scheduled for Thursday, 7/11 at 4 pm and the [facility].

## 2023-05-24 ENCOUNTER — Other Ambulatory Visit: Payer: Medicaid Other

## 2023-05-24 DIAGNOSIS — O3680X0 Pregnancy with inconclusive fetal viability, not applicable or unspecified: Secondary | ICD-10-CM

## 2023-05-25 LAB — HUMAN CHORIONIC GONADOTROPIN(HCG),B-SUBUNIT,QUANTITATIVE): HCG, Beta Chain, Quant, S: 9854 m[IU]/mL

## 2023-05-27 ENCOUNTER — Ambulatory Visit
Admission: RE | Admit: 2023-05-27 | Discharge: 2023-05-27 | Disposition: A | Discharge status: Home / Self Care | Payer: Medicaid Other | Source: Ambulatory Visit | Attending: Obstetrics | Admitting: Obstetrics

## 2023-05-27 DIAGNOSIS — O3680X0 Pregnancy with inconclusive fetal viability, not applicable or unspecified: Secondary | ICD-10-CM | POA: Diagnosis present

## 2023-05-29 ENCOUNTER — Encounter: Payer: Self-pay | Admitting: Obstetrics

## 2023-05-29 ENCOUNTER — Other Ambulatory Visit: Payer: Self-pay | Admitting: Obstetrics

## 2023-05-29 NOTE — Progress Notes (Signed)
EDD updated per [place] report.  Allison [name], CNM

## 2023-05-31 ENCOUNTER — Encounter: Payer: Self-pay | Admitting: Obstetrics and Gynecology

## 2023-06-09 ENCOUNTER — Encounter: Payer: Self-pay | Admitting: Obstetrics and Gynecology

## 2023-06-14 DIAGNOSIS — Z3481 Encounter for supervision of other normal pregnancy, first trimester: Secondary | ICD-10-CM | POA: Insufficient documentation

## 2023-06-23 LAB — OB RESULTS CONSOLE RUBELLA ANTIBODY, IGM: Rubella: IMMUNE

## 2023-06-23 LAB — OB RESULTS CONSOLE VARICELLA ZOSTER ANTIBODY, IGG: Varicella: IMMUNE

## 2023-06-23 LAB — OB RESULTS CONSOLE HEPATITIS B SURFACE ANTIGEN: Hepatitis B Surface Ag: NEGATIVE

## 2023-07-25 ENCOUNTER — Other Ambulatory Visit: Payer: Self-pay

## 2023-07-25 ENCOUNTER — Emergency Department: Payer: Medicaid Other

## 2023-07-25 ENCOUNTER — Emergency Department
Admission: EM | Admit: 2023-07-25 | Discharge: 2023-07-25 | Disposition: A | Discharge status: Home / Self Care | Payer: Medicaid Other | Attending: Emergency Medicine | Admitting: Emergency Medicine

## 2023-07-25 DIAGNOSIS — R102 Pelvic and perineal pain: Secondary | ICD-10-CM | POA: Insufficient documentation

## 2023-07-25 DIAGNOSIS — O26892 Other specified pregnancy related conditions, second trimester: Secondary | ICD-10-CM | POA: Diagnosis present

## 2023-07-25 DIAGNOSIS — Z3A14 14 weeks gestation of pregnancy: Secondary | ICD-10-CM | POA: Insufficient documentation

## 2023-07-25 DIAGNOSIS — O26899 Other specified pregnancy related conditions, unspecified trimester: Secondary | ICD-10-CM

## 2023-07-25 LAB — CBC
HCT: 33.3 % — ABNORMAL LOW (ref 36.0–46.0)
Hemoglobin: 11.6 g/dL — ABNORMAL LOW (ref 12.0–15.0)
MCH: 30.5 pg (ref 26.0–34.0)
MCHC: 34.8 g/dL (ref 30.0–36.0)
MCV: 87.6 fL (ref 80.0–100.0)
Platelets: 296 10*3/uL (ref 150–400)
RBC: 3.8 MIL/uL — ABNORMAL LOW (ref 3.87–5.11)
RDW: 13.2 % (ref 11.5–15.5)
WBC: 8.9 10*3/uL (ref 4.0–10.5)
nRBC: 0 % (ref 0.0–0.2)

## 2023-07-25 LAB — COMPREHENSIVE METABOLIC PANEL
ALT: 12 U/L (ref 0–44)
AST: 15 U/L (ref 15–41)
Albumin: 3.6 g/dL (ref 3.5–5.0)
Alkaline Phosphatase: 36 U/L — ABNORMAL LOW (ref 38–126)
Anion gap: 6 (ref 5–15)
BUN: 8 mg/dL (ref 6–20)
CO2: 19 mmol/L — ABNORMAL LOW (ref 22–32)
Calcium: 8.7 mg/dL — ABNORMAL LOW (ref 8.9–10.3)
Chloride: 109 mmol/L (ref 98–111)
Creatinine, Ser: 0.47 mg/dL (ref 0.44–1.00)
GFR, Estimated: >60 mL/min (ref >60)
Glucose, Bld: 89 mg/dL (ref 70–99)
Potassium: 3.7 mmol/L (ref 3.5–5.1)
Sodium: 134 mmol/L — ABNORMAL LOW (ref 135–145)
Total Bilirubin: 0.2 mg/dL — ABNORMAL LOW (ref 0.3–1.2)
Total Protein: 6.6 g/dL (ref 6.5–8.1)

## 2023-07-25 LAB — LIPASE, BLOOD: Lipase: 29 U/L (ref 11–51)

## 2023-07-25 MED ORDER — CYCLOBENZAPRINE HCL 10 MG PO TABS
5.0000 mg | ORAL_TABLET | Freq: Once | ORAL | Status: AC
  Administered 2023-07-25: 5 mg via ORAL
  Filled 2023-07-25: qty 1

## 2023-07-25 MED ORDER — ACETAMINOPHEN 500 MG PO TABS
1000.0000 mg | ORAL_TABLET | Freq: Once | ORAL | Status: AC
  Administered 2023-07-25: 1000 mg via ORAL
  Filled 2023-07-25: qty 2

## 2023-07-25 NOTE — ED Provider Notes (Signed)
[facility] Provider Note   Event Date/Time   First MD Initiated Contact with Patient 07/25/23 1505     (approximate) History  Abdominal Cramping ([redacted] weeks pregnant)  HPI Allison [name] is a 22 y.o. female with stated past medical history of 14-week pregnancy who presents for persistent right pelvic pain.  Patient denies any abnormal vaginal discharge, vaginal bleeding.  Patient states that she was seen by her OB/GYN earlier this week and told to take pain medication for this pain however she states that it is worsened ROS: Patient currently denies any vision changes, tinnitus, difficulty speaking, facial droop, sore throat, chest pain, shortness of breath,  nausea/vomiting/diarrhea, dysuria, or weakness/numbness/paresthesias in any extremity   Physical Exam  Triage Vital Signs: ED Triage Vitals [07/25/23 1302]  Encounter Vitals Group     BP 112/74     Systolic BP Percentile      Diastolic BP Percentile      Pulse Rate 100     Resp 18     Temp 98.2 F (36.8 C)     Temp src      SpO2 97 %     Weight 180 lb (81.6 kg)     Height 5\' 1"  (1.549 m)     Head Circumference      Peak Flow      Pain Score 8     Pain Loc      Pain Education      Exclude from Growth Chart    Most recent vital signs: Vitals:   07/25/23 1302  BP: 112/74  Pulse: 100  Resp: 18  Temp: 98.2 F (36.8 C)  SpO2: 97%   General: Awake, oriented x4. CV:  Good peripheral perfusion.  Resp:  Normal effort.  Abd:  No distention.  Other:  Young adult obese Caucasian female resting comfortably in no acute distress ED Results / Procedures / Treatments  Labs (all labs ordered are listed, but only abnormal results are displayed) Labs Reviewed  COMPREHENSIVE METABOLIC PANEL - Abnormal; Notable for the following components:      Result Value   Sodium 134 (*)    CO2 19 (*)    Calcium 8.7 (*)    Alkaline Phosphatase 36 (*)    Total Bilirubin 0.2 (*)    All other components within  normal limits  CBC - Abnormal; Notable for the following components:   RBC 3.80 (*)    Hemoglobin 11.6 (*)    HCT 33.3 (*)    All other components within normal limits  LIPASE, BLOOD  POC URINE PREG, ED  RADIOLOGY ED MD interpretation: OB ultrasound interpreted independently by me and shows 15-week 3-day gestation that is intrauterine and live -Agree with radiology assessment Official radiology report(s): US OB Limited > 14 wks  Result Date: 07/25/2023 CLINICAL DATA:  Persistent pelvic pain. EXAM: LIMITED OBSTETRIC ULTRASOUND COMPARISON:  May 27, 2023 FINDINGS: Number of Fetuses: 1 Heart Rate:  136 bpm Movement: Yes Presentation: Cephalic Placental Location: Anterior Previa: No Amniotic Fluid (Subjective):  Within normal limits. AFI:  cm BPD: 2.98 cm 15 w  3 d MATERNAL FINDINGS: Cervix:  Appears closed. Uterus/Adnexae: No abnormality visualized. IMPRESSION: Single live intrauterine pregnancy corresponding to 15 weeks and 3 days gestation. This exam is performed on an emergent basis and does not comprehensively evaluate fetal size, dating, or anatomy; follow-up complete OB US should be considered if further fetal assessment is warranted. Electronically Signed   By: Ulanda Edison.D.  On: 07/25/2023 18:11   PROCEDURES: Critical Care performed: No .1-3 Lead EKG Interpretation  Performed by: Merwyn Katos, MD Authorized by: Merwyn Katos, MD     Interpretation: normal     ECG rate:  71   ECG rate assessment: normal     Rhythm: sinus rhythm     Ectopy: none     Conduction: normal    MEDICATIONS ORDERED IN ED: Medications  cyclobenzaprine (FLEXERIL) tablet 5 mg (5 mg Oral Given 07/25/23 1846)  acetaminophen (TYLENOL) tablet 1,000 mg (1,000 mg Oral Given 07/25/23 1747)   IMPRESSION / MDM / ASSESSMENT AND PLAN / ED COURSE  I reviewed the triage vital signs and the nursing notes.                             The patient is on the cardiac monitor to evaluate for evidence of  arrhythmia and/or significant heart rate changes. Patient's presentation is most consistent with acute presentation with potential threat to life or bodily function. Workup: CBC, BMP, UA, bHCG, Type&Screen, 1st Trimester Ultrasound  Based on History, Exam, and ED Workup patient's presentation not consistent with ectopic pregnancy, molar pregnancy, life-threatening coagulopathy, trauma, serious bacterial infection, central process or other emergency.  Disposition: Will discharge home with return precautions and instruction for prompt OBGYN follow up.   FINAL CLINICAL IMPRESSION(S) / ED DIAGNOSES   Final diagnoses:  Pelvic pain in female  Abdominal pain in pregnancy, antepartum   Rx / DC Orders   ED Discharge Orders     None      Note:  This document was prepared using Dragon voice recognition software and may include unintentional dictation errors.   Merwyn Katos, MD 07/25/23 2352

## 2023-07-25 NOTE — ED Notes (Signed)
Flexeril was verified with pharmacist  prior to administration.

## 2023-07-25 NOTE — ED Notes (Signed)
Dr. Vicente Males was asked to come to the room to talk to patient and her significant other about the ultrasound results. Patient's fiance was agitated, swearing at this writer and stating they have been here too long and the ultrasound was done 2 hours ago. Fiance was informed that the report came in just a half hour ago. Fiance states they will not be here again.

## 2023-07-25 NOTE — ED Triage Notes (Signed)
Pt states coming in with abdominal cramping that started over a week. Pt has been seen by [facility] this week, but the cramping has been getting worse so she was advised to come into the ED. Pt denies vaginal bleeding.

## 2023-07-26 NOTE — Group Note (Deleted)

## 2023-08-15 ENCOUNTER — Emergency Department (HOSPITAL_COMMUNITY)
Admission: EM | Admit: 2023-08-15 | Discharge: 2023-08-16 | Discharge status: Against Medical Advice | Payer: Medicaid Other | Attending: Emergency Medicine | Admitting: Emergency Medicine

## 2023-08-15 ENCOUNTER — Other Ambulatory Visit: Payer: Self-pay

## 2023-08-15 DIAGNOSIS — R220 Localized swelling, mass and lump, head: Secondary | ICD-10-CM | POA: Insufficient documentation

## 2023-08-15 DIAGNOSIS — Z3A18 18 weeks gestation of pregnancy: Secondary | ICD-10-CM | POA: Diagnosis not present

## 2023-08-15 DIAGNOSIS — Z5321 Procedure and treatment not carried out due to patient leaving prior to being seen by health care provider: Secondary | ICD-10-CM | POA: Insufficient documentation

## 2023-08-15 DIAGNOSIS — O219 Vomiting of pregnancy, unspecified: Secondary | ICD-10-CM | POA: Insufficient documentation

## 2023-08-15 DIAGNOSIS — O99891 Other specified diseases and conditions complicating pregnancy: Secondary | ICD-10-CM | POA: Insufficient documentation

## 2023-08-15 NOTE — ED Triage Notes (Signed)
Patient reports left facial swelling onset yesterday morning , denies injury , seen at an urgent care prescribed with Prednisone with no improvement , emesis this evening . Airway is intact /respirations unlabored. She is [redacted] weeks pregnant . Denies abdominal cramping/no vaginal bleeding .

## 2023-08-16 NOTE — ED Notes (Signed)
Pt called for vitals in lobby with no answer

## 2023-10-05 ENCOUNTER — Encounter: Payer: Self-pay | Admitting: Obstetrics and Gynecology

## 2023-10-05 ENCOUNTER — Other Ambulatory Visit: Payer: Self-pay

## 2023-10-05 ENCOUNTER — Observation Stay
Admission: EM | Admit: 2023-10-05 | Discharge: 2023-10-05 | Disposition: A | Discharge status: Home / Self Care | Payer: Medicaid Other | Attending: Certified Nurse Midwife | Admitting: Certified Nurse Midwife

## 2023-10-05 DIAGNOSIS — R1030 Lower abdominal pain, unspecified: Secondary | ICD-10-CM | POA: Insufficient documentation

## 2023-10-05 DIAGNOSIS — O26892 Other specified pregnancy related conditions, second trimester: Secondary | ICD-10-CM | POA: Diagnosis present

## 2023-10-05 DIAGNOSIS — Z3480 Encounter for supervision of other normal pregnancy, unspecified trimester: Secondary | ICD-10-CM

## 2023-10-05 DIAGNOSIS — B3731 Acute candidiasis of vulva and vagina: Secondary | ICD-10-CM

## 2023-10-05 DIAGNOSIS — J45909 Unspecified asthma, uncomplicated: Secondary | ICD-10-CM | POA: Insufficient documentation

## 2023-10-05 DIAGNOSIS — Z3A24 24 weeks gestation of pregnancy: Secondary | ICD-10-CM | POA: Insufficient documentation

## 2023-10-05 DIAGNOSIS — O99512 Diseases of the respiratory system complicating pregnancy, second trimester: Secondary | ICD-10-CM | POA: Diagnosis not present

## 2023-10-05 DIAGNOSIS — Z3490 Encounter for supervision of normal pregnancy, unspecified, unspecified trimester: Secondary | ICD-10-CM

## 2023-10-05 DIAGNOSIS — Z79899 Other long term (current) drug therapy: Secondary | ICD-10-CM | POA: Insufficient documentation

## 2023-10-05 DIAGNOSIS — O26899 Other specified pregnancy related conditions, unspecified trimester: Secondary | ICD-10-CM | POA: Diagnosis present

## 2023-10-05 LAB — CHLAMYDIA/NGC RT PCR (ARMC ONLY)
Chlamydia Tr: NOT DETECTED
N gonorrhoeae: NOT DETECTED

## 2023-10-05 LAB — COMPREHENSIVE METABOLIC PANEL
ALT: 18 U/L (ref 0–44)
AST: 21 U/L (ref 15–41)
Albumin: 3.2 g/dL — ABNORMAL LOW (ref 3.5–5.0)
Alkaline Phosphatase: 58 U/L (ref 38–126)
Anion gap: 4 — ABNORMAL LOW (ref 5–15)
BUN: 6 mg/dL (ref 6–20)
CO2: 22 mmol/L (ref 22–32)
Calcium: 8.4 mg/dL — ABNORMAL LOW (ref 8.9–10.3)
Chloride: 110 mmol/L (ref 98–111)
Creatinine, Ser: 0.5 mg/dL (ref 0.44–1.00)
GFR, Estimated: >60 mL/min (ref >60)
Glucose, Bld: 92 mg/dL (ref 70–99)
Potassium: 3.7 mmol/L (ref 3.5–5.1)
Sodium: 136 mmol/L (ref 135–145)
Total Bilirubin: 0.4 mg/dL (ref <1.2)
Total Protein: 6.4 g/dL — ABNORMAL LOW (ref 6.5–8.1)

## 2023-10-05 LAB — CBC
HCT: 32.3 % — ABNORMAL LOW (ref 36.0–46.0)
Hemoglobin: 10.9 g/dL — ABNORMAL LOW (ref 12.0–15.0)
MCH: 30.5 pg (ref 26.0–34.0)
MCHC: 33.7 g/dL (ref 30.0–36.0)
MCV: 90.5 fL (ref 80.0–100.0)
Platelets: 297 10*3/uL (ref 150–400)
RBC: 3.57 MIL/uL — ABNORMAL LOW (ref 3.87–5.11)
RDW: 13.6 % (ref 11.5–15.5)
WBC: 10.5 10*3/uL (ref 4.0–10.5)
nRBC: 0 % (ref 0.0–0.2)

## 2023-10-05 LAB — URINALYSIS, COMPLETE (UACMP) WITH MICROSCOPIC
Bilirubin Urine: NEGATIVE
Glucose, UA: NEGATIVE mg/dL
Hgb urine dipstick: NEGATIVE
Ketones, ur: NEGATIVE mg/dL
Nitrite: NEGATIVE
Protein, ur: 30 mg/dL — AB
Specific Gravity, Urine: 1.017 (ref 1.005–1.030)
Squamous Epithelial / HPF: >50 /[HPF] (ref 0–5)
pH: 8 (ref 5.0–8.0)

## 2023-10-05 LAB — WET PREP, GENITAL
Clue Cells Wet Prep HPF POC: NONE SEEN
Sperm: NONE SEEN
Trich, Wet Prep: NONE SEEN
WBC, Wet Prep HPF POC: <10 (ref <10)
Yeast Wet Prep HPF POC: NONE SEEN

## 2023-10-05 MED ORDER — MICONAZOLE 3 200 MG VA SUPP: 200.0000 mg | Freq: Every day | VAGINAL | 0 refills | Status: DC

## 2023-10-05 MED ORDER — ACETAMINOPHEN 500 MG PO TABS: 1000.0000 mg | ORAL_TABLET | Freq: Four times a day (QID) | ORAL | Status: DC | PRN

## 2023-10-05 NOTE — OB Triage Note (Signed)
Patient is a 22 yo, G2P0, at 24 weeks 6 days. Patient presents with complaints of pink tinged discharge, decreased fetal movement, and lower abdominal cramping.  Patient denies any LOF. Patient reports she noticed a change in her discharge yesterday and that she has been in 9/10 pain. Monitors applied and assessing. VSS. Initial fetal heart tone 155. Liboon CNM notified of patients arrival to unit. Plan to place in observation for wet prep, UA, and G/C.

## 2023-10-05 NOTE — Progress Notes (Signed)
Pt given discharge instructions including follow up care and labor precautions. Pt verbalizes understanding

## 2023-10-05 NOTE — Discharge Summary (Addendum)
Allison [name] is a 22 y.o. female. She is at [redacted]w[redacted]d gestation. Patient's last menstrual period was 02/12/2023 (exact date). Estimated Date of Delivery: 01/19/24  Prenatal care site: [facility]  Chief complaint: Abdominal Pain  HPI: Allison [name] presents to L&D with concerns of lower abdominal pain. She rates her abdominal pain a 9/10. She states she noticed that her discharge was pink tinged. She also has concerns of decreased fetal movement. Patient reports she first noticed a change in her discharge yesterday.   Factors complicating pregnancy: 1.Obesity, BMI: 36.33 2. Depression  S: Resting comfortably. No contractions, no vaginal bleeding, and no leakage of fluid.       Endorses active fetal movement.   Maternal Medical History:  Past Medical Hx:  has a past medical history of Asthma, Migraine, and Seizures (HCC).    Past Surgical Hx:  has a past surgical history that includes Tonsillectomy; Adenoidectomy; and Dilation and curettage of uterus (N/A, 07/25/2021).   Allergies  Allergen Reactions   Lactose     Other reaction(s): Other (See Comments) Intolerance   Lactose Intolerance (Gi)      Prior to Admission medications   Medication Sig Start Date End Date Taking? Authorizing Provider  albuterol (VENTOLIN HFA) 108 (90 Base) MCG/ACT inhaler Inhale 1-2 puffs into the lungs every 6 (six) hours as needed. 03/09/23 03/08/24 Yes [provider]  miconazole (MICONAZOLE 3) 200 MG vaginal suppository Place 1 suppository (200 mg total) vaginally at bedtime. 10/05/23  Yes Deshon Koslowski, CNM  ipratropium (ATROVENT) 0.06 % nasal spray Place 2 sprays into both nostrils as needed. 04/05/23   [provider]  traZODone (DESYREL) 50 MG tablet Take 50 mg by mouth at bedtime. 04/14/23   [provider]    Social History: She  reports that she has never smoked. She has never been exposed to tobacco smoke. She has never used smokeless tobacco. She reports that she does  not currently use alcohol. She reports that she does not use drugs.  Family History: family history includes Alcohol abuse in her father; Anxiety disorder in her father, maternal grandfather, maternal grandmother, mother, paternal grandmother, sister, and sister; Autism in her brother; Cancer in her maternal grandfather; Cancer (age of onset: 51) in her paternal grandmother; Depression in her father, maternal grandfather, maternal grandmother, mother, paternal grandmother, sister, and sister; Diabetes in her maternal grandfather and maternal grandmother; Down syndrome in her cousin and cousin; Drug abuse in her father and mother; Healthy in her brother, brother, brother, brother, and sister; Irregular heart beat in her maternal grandfather; Ovarian cysts in her mother; Pelvic inflammatory disease in her mother; Post-traumatic stress disorder in her maternal grandfather; Schizophrenia in her sister. No history of gyn cancers  Review of Systems: A full review of systems was performed and negative except as noted in the HPI.    O:  BP 107/68 (BP Location: Left Arm)   Pulse (!) 114   Temp 98.2 F (36.8 C) (Oral)   Resp 18   Ht 5\' 2"  (1.575 m)   Wt 81.6 kg   LMP 02/12/2023 (Exact Date) Comment: states spotted around 03/09/23  BMI 32.92 kg/m  Results for orders placed or performed during the hospital encounter of 10/05/23 (from the past 48 hour(s))  Chlamydia/NGC rt PCR ([facility] only)   Collection Time: 10/05/23  4:04 PM   Specimen: Cervical/Vaginal swab  Result Value Ref Range   Specimen source GC/Chlam ENDOCERVICAL    Chlamydia Tr NOT DETECTED NOT DETECTED  N gonorrhoeae NOT DETECTED NOT DETECTED  Wet prep, genital   Collection Time: 10/05/23  4:04 PM   Specimen: Cervical/Vaginal swab  Result Value Ref Range   Yeast Wet Prep HPF POC NONE SEEN NONE SEEN   Trich, Wet Prep NONE SEEN NONE SEEN   Clue Cells Wet Prep HPF POC NONE SEEN NONE SEEN   WBC, Wet Prep HPF POC <10 <10   Sperm NONE SEEN    Urinalysis, Complete w Microscopic -Urine, Clean Catch   Collection Time: 10/05/23  4:04 PM  Result Value Ref Range   Color, Urine YELLOW (A) YELLOW   APPearance CLOUDY (A) CLEAR   Specific Gravity, Urine 1.017 1.005 - 1.030   pH 8.0 5.0 - 8.0   Glucose, UA NEGATIVE NEGATIVE mg/dL   Hgb urine dipstick NEGATIVE NEGATIVE   Bilirubin Urine NEGATIVE NEGATIVE   Ketones, ur NEGATIVE NEGATIVE mg/dL   Protein, ur 30 (A) NEGATIVE mg/dL   Nitrite NEGATIVE NEGATIVE   Leukocytes,Ua TRACE (A) NEGATIVE   RBC / HPF 0-5 0 - 5 RBC/hpf   WBC, UA 6-10 0 - 5 WBC/hpf   Bacteria, UA MANY (A) NONE SEEN   Squamous Epithelial / HPF >50 0 - 5 /HPF   Mucus PRESENT    Budding Yeast PRESENT   CBC   Collection Time: 10/05/23  5:06 PM  Result Value Ref Range   WBC 10.5 4.0 - 10.5 K/uL   RBC 3.57 (L) 3.87 - 5.11 MIL/uL   Hemoglobin 10.9 (L) 12.0 - 15.0 g/dL   HCT 16.1 (L) 09.6 - 04.5 %   MCV 90.5 80.0 - 100.0 fL   MCH 30.5 26.0 - 34.0 pg   MCHC 33.7 30.0 - 36.0 g/dL   RDW 40.9 81.1 - 91.4 %   Platelets 297 150 - 400 K/uL   nRBC 0.0 0.0 - 0.2 %  Comprehensive metabolic panel   Collection Time: 10/05/23  5:06 PM  Result Value Ref Range   Sodium 136 135 - 145 mmol/L   Potassium 3.7 3.5 - 5.1 mmol/L   Chloride 110 98 - 111 mmol/L   CO2 22 22 - 32 mmol/L   Glucose, Bld 92 70 - 99 mg/dL   BUN 6 6 - 20 mg/dL   Creatinine, Ser 7.82 0.44 - 1.00 mg/dL   Calcium 8.4 (L) 8.9 - 10.3 mg/dL   Total Protein 6.4 (L) 6.5 - 8.1 g/dL   Albumin 3.2 (L) 3.5 - 5.0 g/dL   AST 21 15 - 41 U/L   ALT 18 0 - 44 U/L   Alkaline Phosphatase 58 38 - 126 U/L   Total Bilirubin 0.4 <1.2 mg/dL   GFR, Estimated >95 >62 mL/min   Anion gap 4 (L) 5 - 15     Constitutional: NAD, AAOx3  HE/ENT: extraocular movements grossly intact, moist mucous membranes CV: RRR PULM: nl respiratory effort, CTABL Abd: gravid, non-tender, non-distended, soft  Ext: Non-tender, Nonedmeatous Psych: mood appropriate, speech normal Pelvic :  Deferred SVE:   Deferred   NST: Baseline: 145 bpm Variability: moderate Accels: Present, 10 x 10 Decels: none Toco: irregular   Category: I  RESULTS:  A NST procedure was performed with FHR monitoring and a normal baseline established, appropriate time of 20-40 minutes of evaluation, and accels >2 seen w 10x10 characteristics.  Results show a REACTIVE NST.   Assessment: 22 y.o. [redacted]w[redacted]d here for antenatal surveillance during pregnancy.  Principle diagnosis:  The primary encounter diagnosis was Abdominal pain during pregnancy in second trimester.  Diagnoses of Supervision of other normal pregnancy, antepartum, Pain of round ligament during pregnancy, and Vaginal yeast infection were also pertinent to this visit.   Plan: Labor: Not present.  Fetal Wellbeing: Reassuring Cat 1 tracing. Reactive NST  1,000 mg Tylenol PO offered for pain relief. Patient declined.  GC/Chlam collected on 10/05/2023; Results: Negative Wet Prep collected on 10/05/2023; Results: Negative CBC and CMP collected on 10/05/2023--> Results WNL except:   - Hgb: 10.9   - Patient advised to begin taking ferrous sulfate 325 mg PO to increase                      iron levels.  Urinalysis collected on 10/05/2023--> Budding yeast present    - Miconazole 200 mg (x 3 days) vaginal suppository ordered  Patient advised that the use of a maternity belt, massages, warm baths, heating pad to area, and chiropractic care may provide relief of symptoms from round ligament pain.  Patient advised that OTC Tylenol PO may be taken as needed for comfort and relief of abdominal pain and cramping.  Patient advised to maintain adequate intake of water (at least 8-12 glasses daily). Education about yeast infection and round ligament pain reviewed with patient and information provided to patient.  Patient verbalized understanding and agreed with plan of care.  D/c home stable, precautions reviewed, follow-up as needed.   ----- Roney Jaffe, CNM Certified Nurse Midwife [place]  [facility]

## 2023-11-17 NOTE — L&D Delivery Note (Signed)
 Delivery Note  DETTA MELLIN is a G2P0010 at [redacted]w[redacted]d with an LMP of 02/12/24, not consistent with [place] at [redacted]w[redacted]d.   First Stage: Labor onset: 0155 Augmentation: oxytocin Analgesia /Anesthesia intrapartum: Epidural Date/time: 01/13/24, Amount: gush, and Color: clear GBS: Negative/-- (02/05 0000)  IP Antibiotics: abx: none  Second Stage: Complete dilation at 0644 Onset of pushing at 0657 FHR second stage Baseline: 135 bpm, Variability: Good {> 6 bpm), Accelerations: Reactive, and Decelerations: Variable: moderate decels with pushing   Kiaira presented to L&D for early labor and category 2 tracing She was 2/90/. She progressed  to C/C/+2 with a spontaneous urge to push.  She pushed  effectively over approximately 13 minutes for a spontaneous vaginal birth. Delivery of a viable baby boy on 01/13/2024 . by CNM. Delivery of fetal head in position: Occiput,, Anterior position with restitution to position: Left,, Occiput,, Transverse tight nuchal cord, and body cord delivered through.Anterior then posterior shoulders delivered easily with gentle downward traction. Baby placed on mom's chest, and attended to by baby RN. Cord double clamped after cessation of pulsation, cut by FOB    Third Stage: Oxytocin bolus started after delivery of infant for hemorrhage prophylaxis  Placenta delivered schultz intact with 3 VC @ 0717 Placenta disposition: To Pathology: No  Uterine tone firm / exam; vaginal bleeding: moderate  Laceration: 1st degree and labial laceration identified  Anesthesia for repair: procedures; anesthesia: epidural Repair suture type: 3.0 vicryl Est. Blood Loss (mL): 200  Complications:Diagnoses; OB-GYN delivery complications: none  Mom to postpartum.  Baby to Couplet care / Skin to Skin.  Newborn: Information for the patient's newborn:  Bret, Stamour [[id number]]  Live born female Kipp Brood Birth Weight: 6 lb 10.9 oz (3030 g) APGAR: 8, 9  Newborn Delivery   Birth date/time: 01/13/2024  07:10:00 Delivery type: Vaginal, Spontaneous      Feeding planned: formula feeding  ---------- Chari Manning, CNM Certified Nurse Midwife [place]  [facility]

## 2023-12-04 ENCOUNTER — Observation Stay
Admission: EM | Admit: 2023-12-04 | Discharge: 2023-12-04 | Disposition: A | Discharge status: Home / Self Care | Payer: Medicaid Other | Attending: Obstetrics and Gynecology | Admitting: Obstetrics and Gynecology

## 2023-12-04 ENCOUNTER — Other Ambulatory Visit: Payer: Self-pay

## 2023-12-04 ENCOUNTER — Encounter: Payer: Self-pay | Admitting: Obstetrics and Gynecology

## 2023-12-04 DIAGNOSIS — O368190 Decreased fetal movements, unspecified trimester, not applicable or unspecified: Secondary | ICD-10-CM | POA: Diagnosis present

## 2023-12-04 DIAGNOSIS — Z79899 Other long term (current) drug therapy: Secondary | ICD-10-CM | POA: Insufficient documentation

## 2023-12-04 DIAGNOSIS — O99513 Diseases of the respiratory system complicating pregnancy, third trimester: Secondary | ICD-10-CM | POA: Diagnosis not present

## 2023-12-04 DIAGNOSIS — O99353 Diseases of the nervous system complicating pregnancy, third trimester: Secondary | ICD-10-CM | POA: Insufficient documentation

## 2023-12-04 DIAGNOSIS — J45909 Unspecified asthma, uncomplicated: Secondary | ICD-10-CM | POA: Diagnosis not present

## 2023-12-04 DIAGNOSIS — O368130 Decreased fetal movements, third trimester, not applicable or unspecified: Principal | ICD-10-CM | POA: Insufficient documentation

## 2023-12-04 DIAGNOSIS — G43909 Migraine, unspecified, not intractable, without status migrainosus: Secondary | ICD-10-CM | POA: Diagnosis not present

## 2023-12-04 DIAGNOSIS — Z3480 Encounter for supervision of other normal pregnancy, unspecified trimester: Principal | ICD-10-CM

## 2023-12-04 DIAGNOSIS — Z3A33 33 weeks gestation of pregnancy: Secondary | ICD-10-CM | POA: Insufficient documentation

## 2023-12-04 LAB — WET PREP, GENITAL
Sperm: NONE SEEN
Trich, Wet Prep: NONE SEEN
WBC, Wet Prep HPF POC: <10 (ref <10)
Yeast Wet Prep HPF POC: NONE SEEN

## 2023-12-04 LAB — URINALYSIS, ROUTINE W REFLEX MICROSCOPIC
Bilirubin Urine: NEGATIVE
Glucose, UA: NEGATIVE mg/dL
Hgb urine dipstick: NEGATIVE
Ketones, ur: NEGATIVE mg/dL
Leukocytes,Ua: NEGATIVE
Nitrite: NEGATIVE
Protein, ur: NEGATIVE mg/dL
Specific Gravity, Urine: 1.005 (ref 1.005–1.030)
pH: 6 (ref 5.0–8.0)

## 2023-12-04 MED ORDER — ACETAMINOPHEN 500 MG PO TABS
ORAL_TABLET | ORAL | Status: AC
  Administered 2023-12-04: 1000 mg via ORAL
  Filled 2023-12-04: qty 2

## 2023-12-04 MED ORDER — METRONIDAZOLE 500 MG PO TABS: 500.0000 mg | ORAL_TABLET | Freq: Two times a day (BID) | ORAL | 0 refills | Status: AC

## 2023-12-04 MED ORDER — ACETAMINOPHEN 500 MG PO TABS: 1000.0000 mg | ORAL_TABLET | Freq: Once | ORAL | Status: AC

## 2023-12-04 NOTE — OB Triage Note (Signed)
Pt c/o decreased fetal movement and abdominal pressure. Pt placed on fetal monitor. Fetal movement audible. Pt denies feeling movement. Denies vaginal bleeding. Elaina Hoops

## 2023-12-04 NOTE — Discharge Summary (Signed)
Allison [name] is a 23 y.o. female. She is at [redacted]w[redacted]d gestation. Patient's last menstrual period was 02/12/2023 (exact date). Estimated Date of Delivery: 01/19/24  Prenatal care site: [facility]  Chief complaint: no fetal movement since 2 pm with abdominal pressure  HPI: Allison [name] presents to L&D with complaints of Decreased fetal movement: Time of onset 1400 with abdominal pressure.  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   Schizoaffective disorder (HCC) 07/01/2022   High risk medication use 07/01/2022   At risk for prolonged QT interval syndrome 07/01/2022   Memory loss 07/01/2022   Encounter for supervision of normal first pregnancy in first trimester 06/05/2021   Visual hallucination 01/06/2021   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 no CTX, no VB.no LOF,  Active fetal movement.   Maternal Medical History:  Past Medical Hx:  has a past medical history of Asthma, Migraine, and Seizures (HCC).    Past Surgical Hx:  has a past surgical history that includes Tonsillectomy; Adenoidectomy; and Dilation and curettage of uterus (N/A, 07/25/2021).   Allergies  Allergen Reactions   Lactose     Other  reaction(s): Other (See Comments) Intolerance   Lactose Intolerance (Gi)      Prior to Admission medications   Medication Sig Start Date End Date Taking? Authorizing Provider  albuterol (VENTOLIN HFA) 108 (90 Base) MCG/ACT inhaler Inhale 1-2 puffs into the lungs every 6 (six) hours as needed. 03/09/23 03/08/24 Yes [provider]  ipratropium (ATROVENT) 0.06 % nasal spray Place 2 sprays into both nostrils as needed. Patient not taking: Reported on 12/04/2023 04/05/23   [provider]  miconazole (MICONAZOLE 3) 200 MG vaginal suppository Place 1 suppository (200 mg total) vaginally at bedtime. Patient not taking: Reported on 12/04/2023 10/05/23   Liboon, Jazmine, CNM  traZODone (DESYREL) 50 MG tablet Take 50 mg by mouth at bedtime. Patient not taking: Reported on 12/04/2023 04/14/23   [provider]    Social History: She  reports that she has never smoked. She has never been exposed to tobacco smoke. She has never used smokeless tobacco. She reports that she does not currently use alcohol. She reports that she does not use drugs.  Family History: family history includes Alcohol abuse in her father; Anxiety disorder in her father, maternal grandfather, maternal grandmother, mother, paternal grandmother, sister, and sister; Autism in her brother; Cancer in her maternal grandfather; Cancer (age of onset: 93) in her paternal grandmother; Depression in her father, maternal grandfather, maternal grandmother, mother, paternal grandmother, sister, and sister; Diabetes in her maternal grandfather and maternal grandmother; Down syndrome in her cousin and cousin; Drug abuse in her father and mother; Healthy in her brother, brother, brother, brother, and sister; Irregular heart beat in her maternal grandfather; Ovarian cysts in her  mother; Pelvic inflammatory disease in her mother; Post-traumatic stress disorder in her maternal grandfather; Schizophrenia in her sister. ,no history of gyn  cancers  Review of Systems: A full review of systems was performed and negative except as noted in the HPI.    O:  BP 100/60 (BP Location: Left Arm)   Pulse 100   Temp 98.1 F (36.7 C) (Oral)   Resp 16   Ht 5\' 2"  (1.575 m)   Wt 89.4 kg   LMP 02/12/2023 (Exact Date) Comment: states spotted around 03/09/23  BMI 36.03 kg/m  No results found for this or any previous visit (from the past 48 hours).   Constitutional: NAD, AAOx3  HE/ENT: extraocular movements grossly intact, moist mucous membranes CV: RRR PULM: nl respiratory effort, CTABL Abd: gravid, non-tender, non-distended, soft  Ext: Non-tender, Nonedmeatous Psych: mood appropriate, speech normal Pelvic : deferred SVE:     NST: Baseline FHR: 145 beats/min Variability: moderate Accelerations: present Decelerations: absent Tocometry: irritability Time: at least 20 minutes   Interpretation: Category I INDICATIONS: decreased fetal movement RESULTS:  A NST procedure was performed with FHR monitoring and a normal baseline established, appropriate time of 20-40 minutes of evaluation, and accels >2 seen w 15x15 characteristics.  Results show a REACTIVE NST.    Assessment: 23 y.o. [redacted]w[redacted]d here for antenatal surveillance during pregnancy.  Principle diagnosis: decreased fetal movement   Plan: Labor: not present.  Fetal Wellbeing: Reassuring Cat 1 tracing. Reactive NST  Wet prep + BV (sent prescription for Flagyl) UA neg D/c home stable, precautions reviewed, follow-up as scheduled.   ----- Chari Manning, CNM Certified Nurse Midwife [place]  [facility]

## 2023-12-16 ENCOUNTER — Observation Stay
Admission: EM | Admit: 2023-12-16 | Discharge: 2023-12-16 | Disposition: A | Discharge status: Home / Self Care | Payer: Medicaid Other | Attending: Obstetrics and Gynecology | Admitting: Obstetrics and Gynecology

## 2023-12-16 ENCOUNTER — Other Ambulatory Visit: Payer: Self-pay

## 2023-12-16 ENCOUNTER — Encounter: Payer: Self-pay | Admitting: Obstetrics and Gynecology

## 2023-12-16 DIAGNOSIS — O26853 Spotting complicating pregnancy, third trimester: Secondary | ICD-10-CM | POA: Diagnosis not present

## 2023-12-16 DIAGNOSIS — O99513 Diseases of the respiratory system complicating pregnancy, third trimester: Secondary | ICD-10-CM | POA: Diagnosis not present

## 2023-12-16 DIAGNOSIS — O368130 Decreased fetal movements, third trimester, not applicable or unspecified: Secondary | ICD-10-CM | POA: Diagnosis not present

## 2023-12-16 DIAGNOSIS — O368190 Decreased fetal movements, unspecified trimester, not applicable or unspecified: Secondary | ICD-10-CM | POA: Diagnosis present

## 2023-12-16 DIAGNOSIS — O26899 Other specified pregnancy related conditions, unspecified trimester: Principal | ICD-10-CM | POA: Diagnosis present

## 2023-12-16 DIAGNOSIS — N920 Excessive and frequent menstruation with regular cycle: Secondary | ICD-10-CM | POA: Diagnosis not present

## 2023-12-16 DIAGNOSIS — R102 Pelvic and perineal pain unspecified side: Secondary | ICD-10-CM | POA: Diagnosis present

## 2023-12-16 DIAGNOSIS — J45909 Unspecified asthma, uncomplicated: Secondary | ICD-10-CM | POA: Insufficient documentation

## 2023-12-16 DIAGNOSIS — O26893 Other specified pregnancy related conditions, third trimester: Secondary | ICD-10-CM | POA: Diagnosis present

## 2023-12-16 DIAGNOSIS — Z3480 Encounter for supervision of other normal pregnancy, unspecified trimester: Secondary | ICD-10-CM

## 2023-12-16 LAB — URINALYSIS, COMPLETE (UACMP) WITH MICROSCOPIC
Bilirubin Urine: NEGATIVE
Glucose, UA: NEGATIVE mg/dL
Hgb urine dipstick: NEGATIVE
Ketones, ur: 20 mg/dL — AB
Nitrite: NEGATIVE
Protein, ur: 30 mg/dL — AB
Specific Gravity, Urine: 1.019 (ref 1.005–1.030)
Squamous Epithelial / HPF: >50 /[HPF] (ref 0–5)
pH: 6 (ref 5.0–8.0)

## 2023-12-16 LAB — CBC
HCT: 34.3 % — ABNORMAL LOW (ref 36.0–46.0)
Hemoglobin: 11.5 g/dL — ABNORMAL LOW (ref 12.0–15.0)
MCH: 29.8 pg (ref 26.0–34.0)
MCHC: 33.5 g/dL (ref 30.0–36.0)
MCV: 88.9 fL (ref 80.0–100.0)
Platelets: 307 10*3/uL (ref 150–400)
RBC: 3.86 MIL/uL — ABNORMAL LOW (ref 3.87–5.11)
RDW: 13.2 % (ref 11.5–15.5)
WBC: 11.7 10*3/uL — ABNORMAL HIGH (ref 4.0–10.5)
nRBC: 0 % (ref 0.0–0.2)

## 2023-12-16 LAB — WET PREP, GENITAL
Clue Cells Wet Prep HPF POC: NONE SEEN
Sperm: NONE SEEN
Trich, Wet Prep: NONE SEEN
WBC, Wet Prep HPF POC: <10 (ref <10)
Yeast Wet Prep HPF POC: NONE SEEN

## 2023-12-16 MED ORDER — ACETAMINOPHEN 500 MG PO TABS: 1000.0000 mg | ORAL_TABLET | Freq: Once | ORAL | Status: DC

## 2023-12-16 NOTE — Discharge Summary (Signed)
Allison [name] is a 23 y.o. female. She is at [redacted]w[redacted]d gestation. Patient's last menstrual period was 02/12/2023 (exact date). Estimated Date of Delivery: 01/19/24  Prenatal care site: [facility]  Chief complaint: Abdominal Pain  HPI: Allison [name] presents to L&D with concerns of abdominal pain. Reports cramping and light spotting that started approximately 2 hours ago. Describes spotting as light, bright red. She rates pain of cramping 8/10. She states that the cramping is intermittent. She also has concerns of decreased fetal movement, however, she reports she has felt baby move several times throughout he day since arrival to L&D triage. Denies leakage of fluid and the passing of clots. Her and partner state that they did have recent sexual intercourse in the past 24 hours.   Factors complicating pregnancy: Obesity- BMI: 36.03 H/o Depression H/o Schizoaffective disorder  S: Resting comfortably. No leakage of fluid.   Maternal Medical History:  Past Medical Hx:  has a past medical history of Asthma, Migraine, and Seizures (HCC).    Past Surgical Hx:  has a past surgical history that includes Tonsillectomy; Adenoidectomy; and Dilation and curettage of uterus (N/A, 07/25/2021).   Allergies  Allergen Reactions   Lactose     Other reaction(s): Other (See Comments) Intolerance   Lactose Intolerance (Gi)    Prednisone Hives     Prior to Admission medications   Medication Sig Start Date End Date Taking? Authorizing Provider  albuterol (VENTOLIN HFA) 108 (90 Base) MCG/ACT inhaler Inhale 1-2 puffs into the lungs every 6 (six) hours as needed. 03/09/23 03/08/24 Yes [provider]  ipratropium (ATROVENT) 0.06 % nasal spray Place 2 sprays into both nostrils as needed. Patient not taking: Reported on 12/04/2023 04/05/23   [provider]    Social History: She  reports that she has never smoked. She has never been exposed to tobacco smoke. She has never used smokeless  tobacco. She reports that she does not currently use alcohol. She reports that she does not use drugs.  Family History: family history includes Alcohol abuse in her father; Anxiety disorder in her father, maternal grandfather, maternal grandmother, mother, paternal grandmother, sister, and sister; Autism in her brother; Cancer in her maternal grandfather; Cancer (age of onset: 106) in her paternal grandmother; Depression in her father, maternal grandfather, maternal grandmother, mother, paternal grandmother, sister, and sister; Diabetes in her maternal grandfather and maternal grandmother; Down syndrome in her cousin and cousin; Drug abuse in her father and mother; Healthy in her brother, brother, brother, brother, and sister; Irregular heart beat in her maternal grandfather; Ovarian cysts in her mother; Pelvic inflammatory disease in her mother; Post-traumatic stress disorder in her maternal grandfather; Schizophrenia in her sister. No history of gyn cancers  Review of Systems:  Review of Systems  Constitutional: Negative.   Eyes: Negative.   Respiratory: Negative.    Cardiovascular: Negative.   Gastrointestinal:  Positive for abdominal pain (reports lower abdominal pain and on sides).  Musculoskeletal: Negative.   Neurological: Negative.   Psychiatric/Behavioral: Negative.      O:  BP 112/74 (BP Location: Left Arm)   Pulse 90   Temp 97.8 F (36.6 C) (Oral)   Resp 18   Ht 5\' 2"  (1.575 m)   Wt 89.4 kg   LMP 02/12/2023 (Exact Date) Comment: states spotted around 03/09/23  SpO2 98%   BMI 36.03 kg/m      12/16/2023    7:41 PM 12/16/2023    6:04 PM 12/16/2023    6:01 PM  Vitals with BMI  Height  5\' 2"    Weight  197 lbs   BMI  36.02   Systolic   112  Diastolic   74  Pulse 90  120    Results for orders placed or performed during the hospital encounter of 12/16/23 (from the past 48 hours)  Wet prep, genital   Collection Time: 12/16/23  6:17 PM   Specimen: Vaginal  Result Value Ref  Range   Yeast Wet Prep HPF POC NONE SEEN NONE SEEN   Trich, Wet Prep NONE SEEN NONE SEEN   Clue Cells Wet Prep HPF POC NONE SEEN NONE SEEN   WBC, Wet Prep HPF POC <10 <10   Sperm NONE SEEN   Urinalysis, Complete w Microscopic -Urine, Clean Catch   Collection Time: 12/16/23  6:17 PM  Result Value Ref Range   Color, Urine YELLOW (A) YELLOW   APPearance CLOUDY (A) CLEAR   Specific Gravity, Urine 1.019 1.005 - 1.030   pH 6.0 5.0 - 8.0   Glucose, UA NEGATIVE NEGATIVE mg/dL   Hgb urine dipstick NEGATIVE NEGATIVE   Bilirubin Urine NEGATIVE NEGATIVE   Ketones, ur 20 (A) NEGATIVE mg/dL   Protein, ur 30 (A) NEGATIVE mg/dL   Nitrite NEGATIVE NEGATIVE   Leukocytes,Ua TRACE (A) NEGATIVE   RBC / HPF 0-5 0 - 5 RBC/hpf   WBC, UA 0-5 0 - 5 WBC/hpf   Bacteria, UA FEW (A) NONE SEEN   Squamous Epithelial / HPF >50 0 - 5 /HPF   Mucus PRESENT    Hyaline Casts, UA PRESENT    Non Squamous Epithelial PRESENT (A) NONE SEEN  CBC   Collection Time: 12/16/23  6:56 PM  Result Value Ref Range   WBC 11.7 (H) 4.0 - 10.5 K/uL   RBC 3.86 (L) 3.87 - 5.11 MIL/uL   Hemoglobin 11.5 (L) 12.0 - 15.0 g/dL   HCT 72.5 (L) 36.6 - 44.0 %   MCV 88.9 80.0 - 100.0 fL   MCH 29.8 26.0 - 34.0 pg   MCHC 33.5 30.0 - 36.0 g/dL   RDW 34.7 42.5 - 95.6 %   Platelets 307 150 - 400 K/uL   nRBC 0.0 0.0 - 0.2 %     Constitutional: NAD, AAOx3  HE/ENT: extraocular movements grossly intact, moist mucous membranes CV: RRR PULM: nl respiratory effort, CTABL Abd: gravid, non-tender, non-distended, soft  Ext: Non-tender, Nonedmeatous Psych: mood appropriate, speech normal Pelvic :  moderate amount of white physiologic discharge noted during speculum exam. No vaginal bleeding and no pooling of fluid observed during the speculum exam.  SVE: Dilation: Closed   NST:  Baseline: 135 bpm Variability: moderate Accels: Present Decels: none Toco: irregular  INDICATIONS: decreased fetal movement RESULTS:  A NST procedure was performed  with FHR monitoring and a normal baseline established, appropriate time of 20-40 minutes of evaluation, and accels >2 seen w 15x15 characteristics.  Results show a REACTIVE NST.  Category: I  Assessment: 23 y.o. [redacted]w[redacted]d here for antenatal surveillance during pregnancy.  Principle diagnosis:  The primary encounter diagnosis was Pain of round ligament affecting pregnancy, antepartum. Diagnoses of Supervision of other normal pregnancy, antepartum and Decreased fetal movements in third trimester, single or unspecified fetus were also pertinent to this visit.   Plan: Antenatal surveillance Labor: Not present. [place] and toco placed on patient. FHR observed for approximately 120 mins. Toco showed none to irregular contractions. Patient denied feeling contractions. Cat 1 tracing. Cervical exam and speculum exam performed. No vaginal bleeding noted  and no pooling of fluid. Exam revealed cervix to be closed reassuring against labor and PPROM.   2. Decreased fetal movement Fetal Wellbeing: Reassuring Cat 1 tracing. Reactive NST   3. Abdominal Pain/ Round ligament pain  Tylenol 1,000 mg offered. Patient declined.  HR range from 70-115.  Wet prep collected (12/16/2023); Results: WNL   - Wet Prep--> Negative Urinalysis (12/16/2023); Results: WNL   - Nitrites --> Negative   - Protein --> 30   - Ketones --> 20  - Patient advised to maintain adequate hydration (at least 8-12 glasses of          water per day. Patient verbalized understanding.  CBC (12/16/2023); Results: WNL   - WBC: 11.7  - RBC: 3.86  - Hgb: 11.5 Patient advised that OTC Tylenol as needed may provide relief of discomfort        as well as a maternity belt, warm baths, massages, gentle stretching, and        applying a heating pad (on the lowest setting) to area of discomfort.  Education on Vevay reviewed and provided to patient in AVS.   4. Spotting  Patient reassured that spotting after sexual intercourse is normal/common in  pregnancy and usually resolved on own within 24-72 hours.  No vaginal bleeding noted during speculum exam.    - D/c home stable, strict return precautions reviewed, follow-up as needed.  - Plan of care reviewed with S.D. Jean Rosenthal, MD.   ----- Roney Jaffe, CNM Certified Nurse Midwife [facility]

## 2023-12-16 NOTE — OB Triage Note (Signed)
Patient discharged home in stable condition by provider. Discharge instructions given. Patient verbalized understanding, no questions or concerns. Pt discharged in personal vehicle with support person. Labor precautions given.   Dinesh Ulysse B. Adelfa Lozito, RN BSN 12/16/2023 7:58 PM

## 2023-12-16 NOTE — OB Triage Note (Signed)
23 y.o G2P0 presents to L&D triage at [redacted]w[redacted]d for abdominal cramping and light spotting that began ~2 hours ago. She currently rates the pain as an 8/10 and says that the pain is intermittent. Pt reports decreased fetal movement but has felt baby several times throughout the day and since arrival to the unit. Pt reports spotting is light but bright red; denies clots. Pt denies LOF. Monitors applied and assessing, labs being processed. [place]oon CNM aware of pt arrival.

## 2023-12-22 LAB — OB RESULTS CONSOLE RPR: RPR: NONREACTIVE

## 2023-12-22 LAB — OB RESULTS CONSOLE GBS: GBS: NEGATIVE

## 2023-12-22 LAB — OB RESULTS CONSOLE HIV ANTIBODY (ROUTINE TESTING): HIV: NONREACTIVE

## 2023-12-31 IMAGING — CR DG THORACIC SPINE 2V
1 series · 3 of 3 positions shown · non-contrast
Comparison: None Available.

CLINICAL DATA: Back pain

EXAM:
THORACIC SPINE 2 VIEWS

[Series 1: dg thoracic spine 2 view · 0.14mm/px · 3 of 3 slices shown]
[im 1/3]
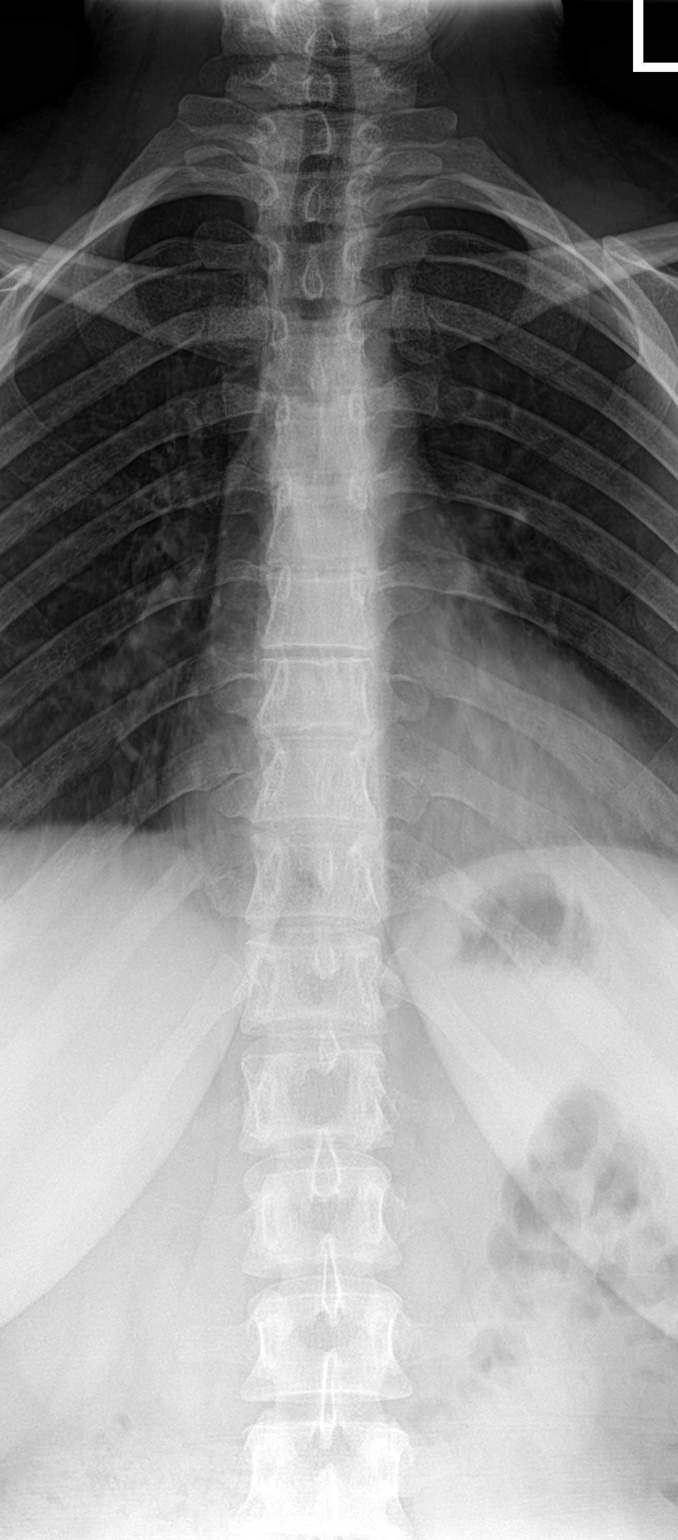
[im 2/3]
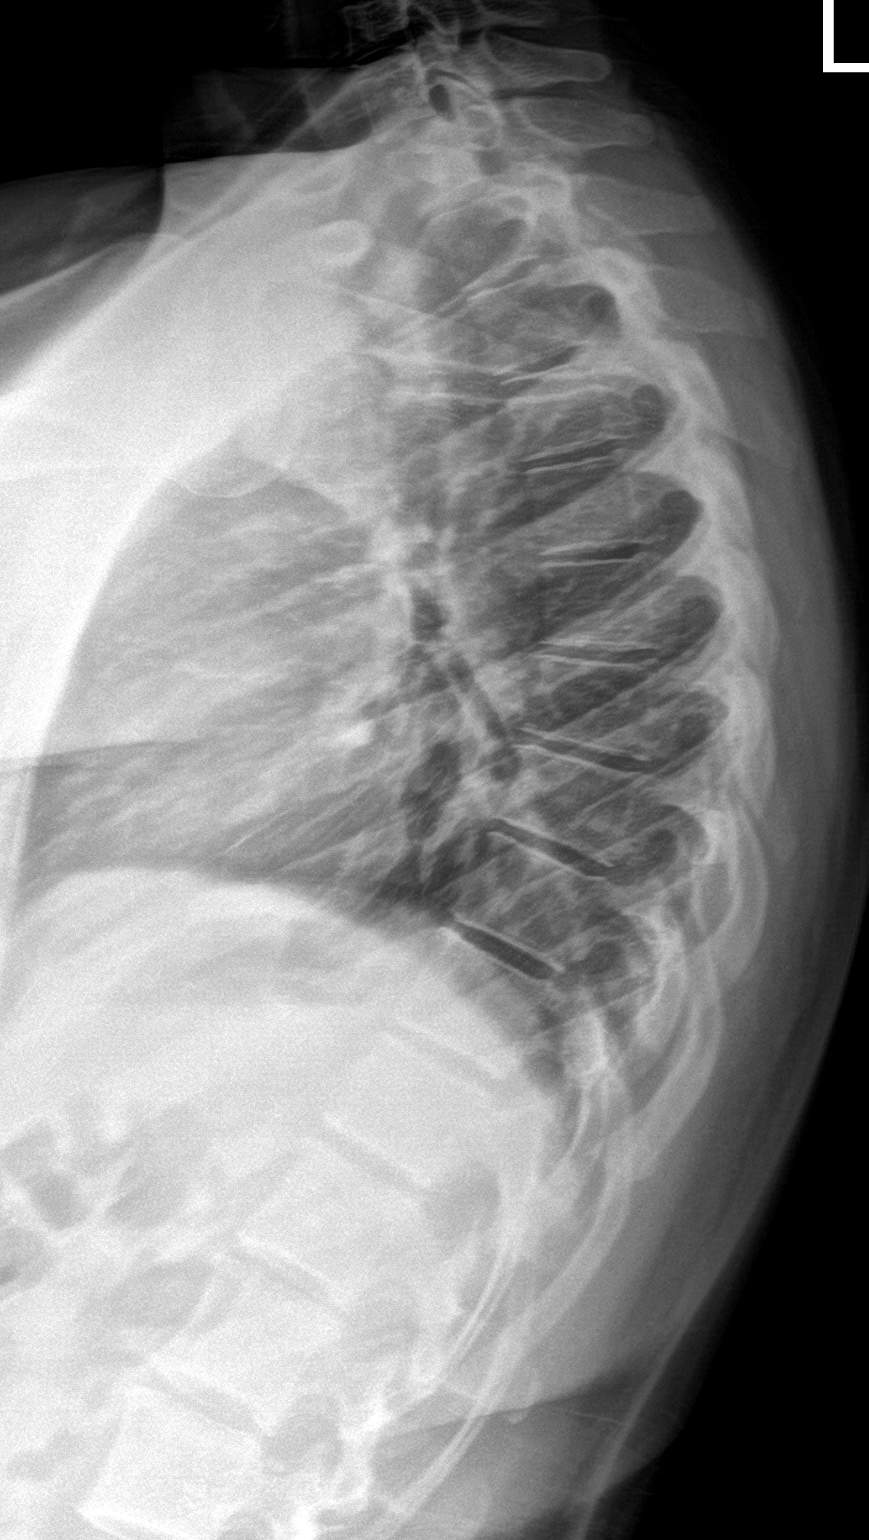
[im 3/3]
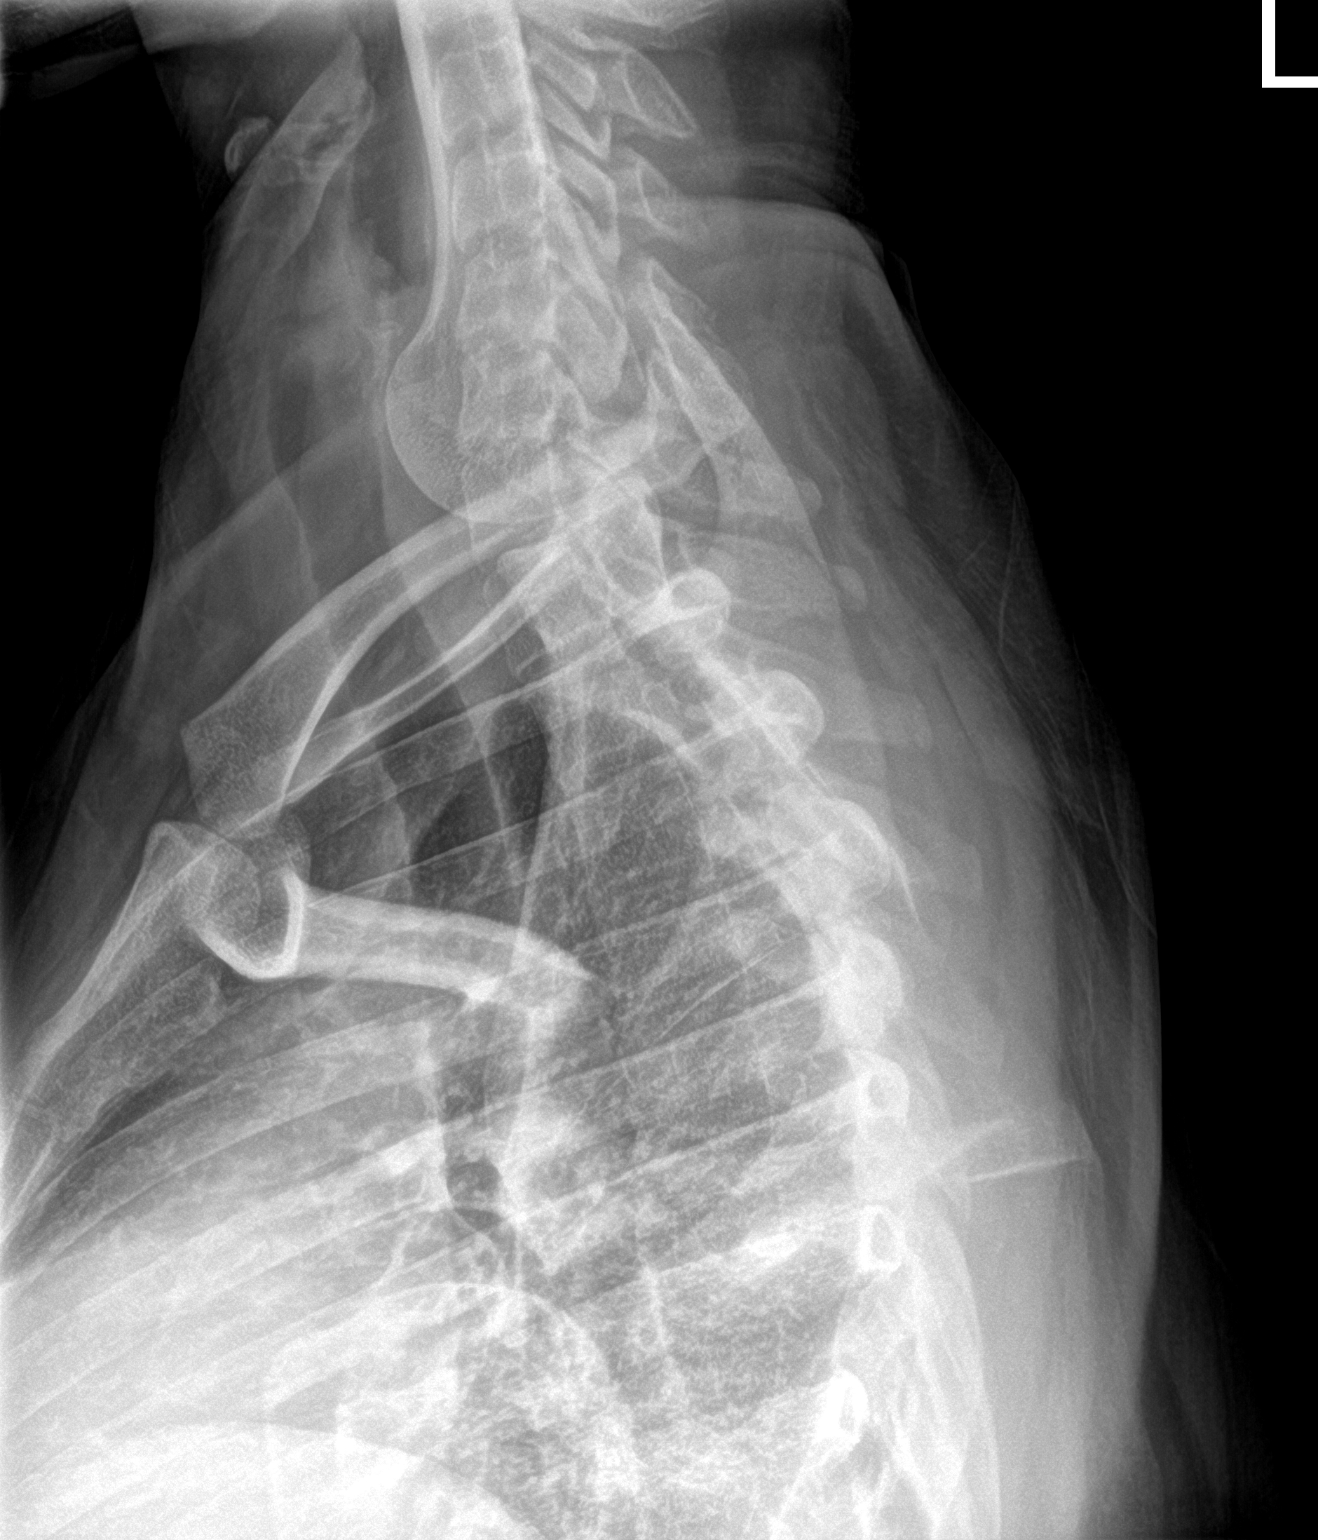

[3 of 3 positions shown; findings below may reference images not displayed]

FINDINGS: There is no evidence of thoracic spine fracture. Alignment is
normal. No other significant bone abnormalities are identified.
IMPRESSION: Negative.

## 2023-12-31 IMAGING — CR DG LUMBAR SPINE 2-3V
1 series · 2 of 2 positions shown · non-contrast
Comparison: None Available.

CLINICAL DATA: Low back pain

EXAM:
LUMBAR SPINE - 2-3 VIEW

[Series 1: dg lumbar spine 2-3 views · 0.14mm/px · 2 of 2 slices shown]
[im 1/2]
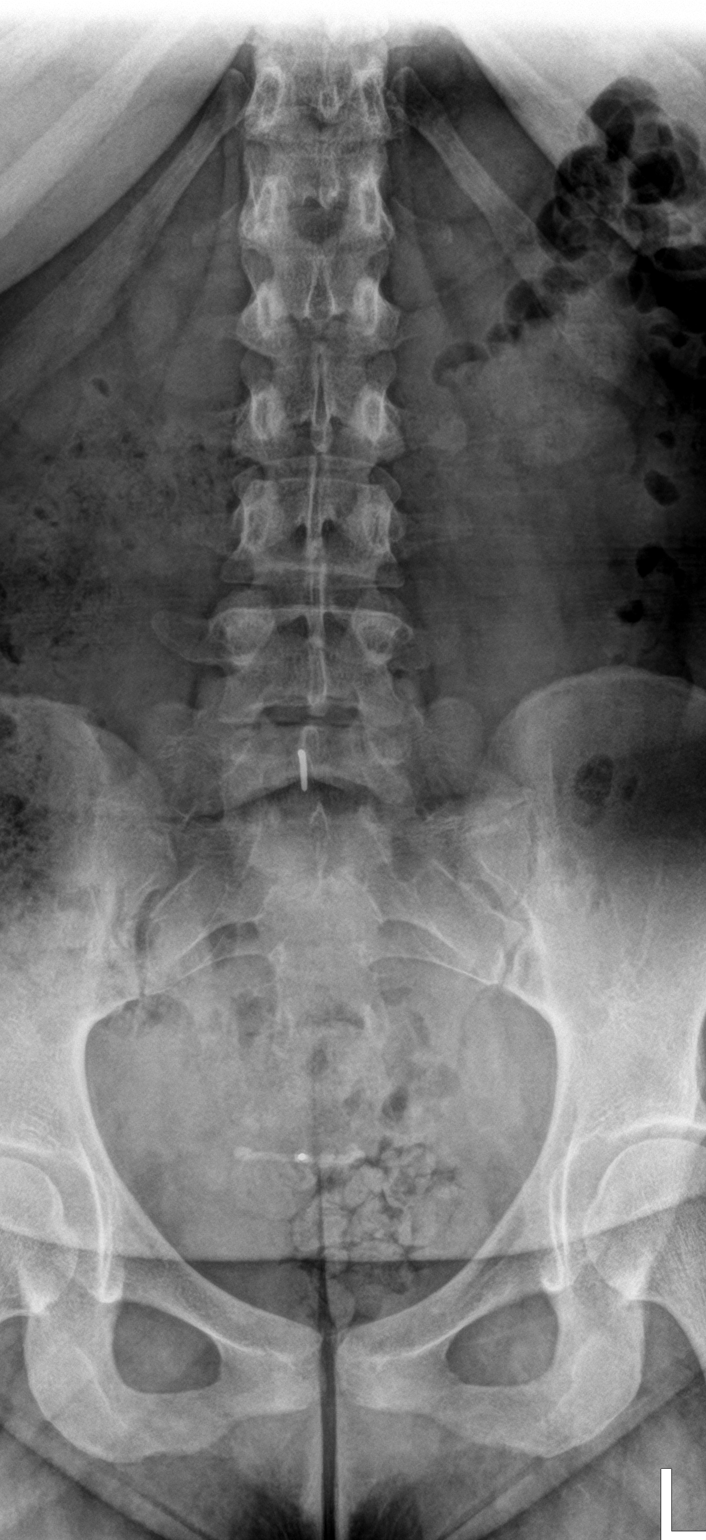
[im 2/2]
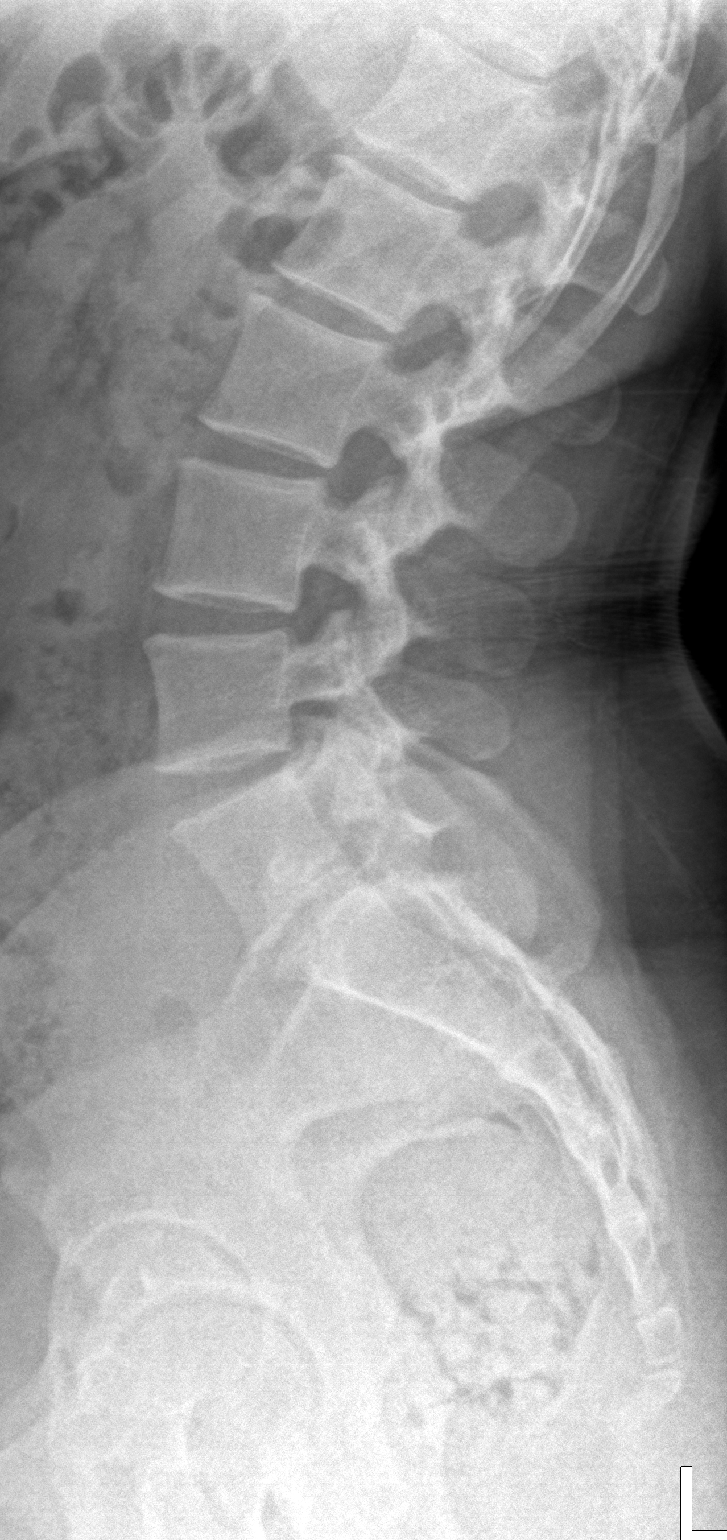

[2 of 2 positions shown; findings below may reference images not displayed]

FINDINGS: Lumbar vertebral body height and alignment are preserved. Mild
intervertebral disc space narrowing at L5-S1. Appears to be an IUD
in the pelvis.
IMPRESSION: Early degenerative changes with no acute fracture identified.

## 2024-01-01 ENCOUNTER — Other Ambulatory Visit: Payer: Self-pay

## 2024-01-01 ENCOUNTER — Inpatient Hospital Stay
Admission: EM | Admit: 2024-01-01 | Discharge: 2024-01-01 | Disposition: A | Discharge status: Home / Self Care | Payer: Medicaid Other | Attending: Certified Nurse Midwife | Admitting: Certified Nurse Midwife

## 2024-01-01 ENCOUNTER — Encounter: Payer: Self-pay | Admitting: Obstetrics and Gynecology

## 2024-01-01 DIAGNOSIS — O99213 Obesity complicating pregnancy, third trimester: Secondary | ICD-10-CM | POA: Diagnosis not present

## 2024-01-01 DIAGNOSIS — Z3A37 37 weeks gestation of pregnancy: Secondary | ICD-10-CM | POA: Insufficient documentation

## 2024-01-01 DIAGNOSIS — F259 Schizoaffective disorder, unspecified: Secondary | ICD-10-CM | POA: Diagnosis not present

## 2024-01-01 DIAGNOSIS — O99343 Other mental disorders complicating pregnancy, third trimester: Secondary | ICD-10-CM | POA: Insufficient documentation

## 2024-01-01 DIAGNOSIS — O471 False labor at or after 37 completed weeks of gestation: Secondary | ICD-10-CM | POA: Insufficient documentation

## 2024-01-01 DIAGNOSIS — Z3480 Encounter for supervision of other normal pregnancy, unspecified trimester: Secondary | ICD-10-CM

## 2024-01-01 DIAGNOSIS — N920 Excessive and frequent menstruation with regular cycle: Secondary | ICD-10-CM

## 2024-01-01 MED ORDER — LACTATED RINGERS IV SOLN: INTRAVENOUS | Status: DC

## 2024-01-01 MED ORDER — LACTATED RINGERS IV BOLUS
1000.0000 mL | Freq: Once | INTRAVENOUS | Status: AC
  Administered 2024-01-01: 1000 mL via INTRAVENOUS

## 2024-01-01 NOTE — OB Triage Note (Signed)
 Patient arrived with complaints of contractions that started at 5am this morning but got more regular about an hour or 2 ago coming every 3-7 min. Pt states baby is moving less but moving, pt denies LOF. Has had spotting since cervical check wed. Pt also has had loose stools since Thursday. Monitors applied and assessing.

## 2024-01-01 NOTE — Discharge Summary (Signed)
 Patient ID: TRINADY MILEWSKI MRN: [id number] DOB/AGE: 2001-01-08 23 y.o.  Admit date: 01/01/2024 Discharge date: 01/01/2024  Admission Diagnoses: 23yo G2P0 at 343w3d present with uterine contractions for a labor check.  Discharge Diagnoses: Early or false labor  Factors complicating pregnancy: Obesity BMI: 36.33 H/o mental health diagnoses: depression, schizoaffective disorder   Prenatal Procedures: NST  Consults: None  Significant Diagnostic Studies:  No results found for this or any previous visit (from the past week).  Treatments: IV hydration  Hospital Course:  This is a 23 y.o. G2P0010 with IUP at [redacted]w[redacted]d seen for a labor check, noted to have a cervical exam of 1/TH/High.  No leaking of fluid and no bleeding.  She was observed, fetal heart rate monitoring remained reassuring, and she had no signs/symptoms of progressing labor or other maternal-fetal concerns.  Her cervical exam was unchanged from admission.  She was deemed stable for discharge to home with outpatient follow up.  Discharge Physical Exam:  BP 101/72   Pulse 85   Temp 98.1 F (36.7 C) (Oral)   Resp 18   LMP 02/12/2023 (Exact Date) Comment: states spotted around 03/09/23  General: NAD CV: RRR Pulm: CTABL, nl effort ABD: s/nd/nt, gravid DVT Evaluation: LE non-ttp, no evidence of DVT on exam.  NST: FHR baseline: 145 bpm Variability: moderate Accelerations: yes Decelerations: none Category/reactivity: reactive   TOCO: mild, occasional  SVE: deferred  Dilation: 1 Effacement (%): Thick Cervical Position: Posterior Station: Ballotable Exam by:: Greeson RN   Discharge Condition: Stable  Disposition: Discharge disposition: 01-Home or Self Care        Allergies as of 01/01/2024       Reactions   Lactose    Other reaction(s): Other (See Comments) Intolerance   Lactose Intolerance (gi)    Prednisone Hives        Medication List     TAKE these medications    albuterol 108 (90 Base)  MCG/ACT inhaler Commonly known as: VENTOLIN HFA Inhale 1-2 puffs into the lungs every 6 (six) hours as needed.   ipratropium 0.06 % nasal spray Commonly known as: ATROVENT Place 2 sprays into both nostrils as needed.         SignedHaroldine Laws, CNM 01/01/2024 8:00 PM

## 2024-01-04 ENCOUNTER — Other Ambulatory Visit: Payer: Self-pay

## 2024-01-04 ENCOUNTER — Encounter: Payer: Self-pay | Admitting: Obstetrics and Gynecology

## 2024-01-04 ENCOUNTER — Observation Stay
Admission: EM | Admit: 2024-01-04 | Discharge: 2024-01-05 | Disposition: A | Discharge status: Home / Self Care | Payer: Medicaid Other | Attending: Obstetrics and Gynecology | Admitting: Obstetrics and Gynecology

## 2024-01-04 DIAGNOSIS — Z3A37 37 weeks gestation of pregnancy: Secondary | ICD-10-CM | POA: Insufficient documentation

## 2024-01-04 DIAGNOSIS — O471 False labor at or after 37 completed weeks of gestation: Secondary | ICD-10-CM | POA: Diagnosis present

## 2024-01-04 DIAGNOSIS — N920 Excessive and frequent menstruation with regular cycle: Secondary | ICD-10-CM

## 2024-01-04 DIAGNOSIS — Z3480 Encounter for supervision of other normal pregnancy, unspecified trimester: Principal | ICD-10-CM

## 2024-01-04 DIAGNOSIS — O479 False labor, unspecified: Secondary | ICD-10-CM | POA: Diagnosis present

## 2024-01-04 DIAGNOSIS — G43909 Migraine, unspecified, not intractable, without status migrainosus: Secondary | ICD-10-CM | POA: Insufficient documentation

## 2024-01-04 DIAGNOSIS — O99353 Diseases of the nervous system complicating pregnancy, third trimester: Secondary | ICD-10-CM | POA: Insufficient documentation

## 2024-01-04 MED ORDER — BUTALBITAL-APAP-CAFFEINE 50-325-40 MG PO TABS
1.0000 | ORAL_TABLET | Freq: Once | ORAL | Status: AC
  Administered 2024-01-04: 1 via ORAL
  Filled 2024-01-04: qty 1

## 2024-01-04 NOTE — OB Triage Note (Signed)
 Pt arrives with c/o ctx's and migraine HA which has lasted for three days.

## 2024-01-04 NOTE — Discharge Summary (Signed)
 Patient ID: GEM CONKLE MRN: [id number] DOB/AGE: 2001/02/18 22 y.o.  Admit date: 01/04/2024 Discharge date: 01/05/2024  Admission Diagnoses: 22yo G2P0 at [redacted]w[redacted]d present with uterine contractions and a migraine headache that has last for 3 days.  Discharge Diagnoses: Early or false labor, Migraine resolved  Factors complicating pregnancy: Obesity BMI: 36.33 H/o mental health diagnoses: depression, schizoaffective disorder   Prenatal Procedures: NST  Consults: None  Significant Diagnostic Studies:  No results found for this or any previous visit (from the past week).  Treatments: IV hydration  Hospital Course:  This is a 23 y.o. G2P0010 with IUP at [redacted]w[redacted]d seen for a labor check, noted to have a cervical exam of 1/70/High.  Fioricet given for migraine.  No leaking of fluid and no bleeding.  She was observed, fetal heart rate monitoring remained reassuring, and she had no signs/symptoms of progressing labor or other maternal-fetal concerns.  Her cervical exam was unchanged from admission.  She was deemed stable for discharge to home with outpatient follow up.  Discharge Physical Exam:  BP 98/60   Pulse 82   Temp 98 F (36.7 C) (Oral)   Resp 18   Ht 5\' 2"  (1.575 m)   Wt 89.8 kg   LMP 02/12/2023 (Exact Date) Comment: states spotted around 03/09/23  BMI 36.21 kg/m   General: NAD CV: RRR Pulm: nl effort ABD: s/nd/nt, gravid DVT Evaluation: LE non-ttp, no evidence of DVT on exam.  NST: FHR baseline: 125 bpm Variability: moderate Accelerations: yes Decelerations: none Category/reactivity: reactive   TOCO: mild, occasional  SVE:  Dilation: 1 Effacement (%): 70 Station: Ballotable Exam by:: Katie A RN   Discharge Condition: Stable  Disposition:  Discharge disposition: 01-Home or Self Care        Allergies as of 01/05/2024       Reactions   Lactose    Other reaction(s): Other (See Comments) Intolerance   Lactose Intolerance (gi)    Prednisone Hives         Medication List     TAKE these medications    albuterol 108 (90 Base) MCG/ACT inhaler Commonly known as: VENTOLIN HFA Inhale 1-2 puffs into the lungs every 6 (six) hours as needed.   ipratropium 0.06 % nasal spray Commonly known as: ATROVENT Place 2 sprays into both nostrils as needed.         Follow-up Information     [facility] Follow up.   Why: Keep all scheduled appointments Contact information: [address] [place] [address] [phone]                Signed:  Quillian Quince 01/05/2024 12:48 AM

## 2024-01-05 DIAGNOSIS — O471 False labor at or after 37 completed weeks of gestation: Secondary | ICD-10-CM | POA: Diagnosis not present

## 2024-01-05 NOTE — Progress Notes (Signed)
 Patient reports decreased dizziness and HA pain 4/10. Pt educated on discharge plans and labor precautions. Pt has appt in the morning with clinic.

## 2024-01-12 ENCOUNTER — Encounter: Payer: Self-pay | Admitting: Obstetrics and Gynecology

## 2024-01-12 ENCOUNTER — Inpatient Hospital Stay
Admission: EM | Admit: 2024-01-12 | Discharge: 2024-01-14 | DRG: 807 | Disposition: A | Discharge status: Home / Self Care | Payer: Medicaid Other | Attending: Obstetrics | Admitting: Obstetrics

## 2024-01-12 ENCOUNTER — Observation Stay
Admission: EM | Admit: 2024-01-12 | Discharge: 2024-01-12 | Disposition: A | Discharge status: Home / Self Care | Payer: Medicaid Other | Source: Home / Self Care | Admitting: Obstetrics and Gynecology

## 2024-01-12 ENCOUNTER — Other Ambulatory Visit: Payer: Self-pay

## 2024-01-12 DIAGNOSIS — O99343 Other mental disorders complicating pregnancy, third trimester: Secondary | ICD-10-CM | POA: Insufficient documentation

## 2024-01-12 DIAGNOSIS — E669 Obesity, unspecified: Secondary | ICD-10-CM | POA: Insufficient documentation

## 2024-01-12 DIAGNOSIS — O471 False labor at or after 37 completed weeks of gestation: Principal | ICD-10-CM | POA: Insufficient documentation

## 2024-01-12 DIAGNOSIS — O479 False labor, unspecified: Secondary | ICD-10-CM | POA: Diagnosis present

## 2024-01-12 DIAGNOSIS — Z3A39 39 weeks gestation of pregnancy: Secondary | ICD-10-CM | POA: Insufficient documentation

## 2024-01-12 DIAGNOSIS — O99213 Obesity complicating pregnancy, third trimester: Secondary | ICD-10-CM | POA: Insufficient documentation

## 2024-01-12 DIAGNOSIS — O99214 Obesity complicating childbirth: Secondary | ICD-10-CM | POA: Diagnosis present

## 2024-01-12 DIAGNOSIS — Z3480 Encounter for supervision of other normal pregnancy, unspecified trimester: Principal | ICD-10-CM

## 2024-01-12 DIAGNOSIS — N920 Excessive and frequent menstruation with regular cycle: Secondary | ICD-10-CM

## 2024-01-12 DIAGNOSIS — Z833 Family history of diabetes mellitus: Secondary | ICD-10-CM

## 2024-01-12 MED ORDER — ACETAMINOPHEN 500 MG PO TABS: 1000.0000 mg | ORAL_TABLET | Freq: Four times a day (QID) | ORAL | Status: DC | PRN

## 2024-01-12 MED ORDER — CALCIUM CARBONATE ANTACID 500 MG PO CHEW: 2.0000 | CHEWABLE_TABLET | ORAL | Status: DC | PRN

## 2024-01-12 NOTE — Discharge Summary (Signed)
 Allison [name] is a 23 y.o. female. She is at [redacted]w[redacted]d gestation. Patient's last menstrual period was 02/12/2023 (exact date). Estimated Date of Delivery: 01/19/24  Prenatal care site: [facility]  Chief complaint: observationUterine contractions: that started last night around 2330 that are increasingly getting more intense and closer together  HPI: Allison [name] presents to L&D with complaints of uterine contractions  Factors complicating pregnancy: Obesity Anxiety and depression Hx of head injury Hx of seizures  S: Resting comfortably. no CTX, no VB.no LOF,  Active fetal movement.   Maternal Medical History:  Past Medical Hx:  has a past medical history of Asthma, Migraine, and Seizures (HCC).    Past Surgical Hx:  has a past surgical history that includes Tonsillectomy; Adenoidectomy; and Dilation and curettage of uterus (N/A, 07/25/2021).   Allergies  Allergen Reactions   Lactose     Other reaction(s): Other (See Comments) Intolerance   Lactose Intolerance (Gi)    Prednisone Hives     Prior to Admission medications   Medication Sig Start Date End Date Taking? Authorizing Provider  albuterol (VENTOLIN HFA) 108 (90 Base) MCG/ACT inhaler Inhale 1-2 puffs into the lungs every 6 (six) hours as needed. 03/09/23 03/08/24  [provider]  ipratropium (ATROVENT) 0.06 % nasal spray Place 2 sprays into both nostrils as needed. Patient not taking: Reported on 12/04/2023 04/05/23   [provider]    Social History: She  reports that she has never smoked. She has never been exposed to tobacco smoke. She has never used smokeless tobacco. She reports that she does not currently use alcohol. She reports that she does not use drugs.  Family History: family history includes Alcohol abuse in her father; Anxiety disorder in her father, maternal grandfather, maternal grandmother, mother, paternal grandmother, sister, and sister; Autism in her brother; Cancer in her maternal  grandfather; Cancer (age of onset: 60) in her paternal grandmother; Depression in her father, maternal grandfather, maternal grandmother, mother, paternal grandmother, sister, and sister; Diabetes in her maternal grandfather and maternal grandmother; Down syndrome in her cousin and cousin; Drug abuse in her father and mother; Healthy in her brother, brother, brother, brother, and sister; Irregular heart beat in her maternal grandfather; Ovarian cysts in her mother; Pelvic inflammatory disease in her mother; Post-traumatic stress disorder in her maternal grandfather; Schizophrenia in her sister. ,no history of gyn cancers  Review of Systems: A full review of systems was performed and negative except as noted in the HPI.    O:  BP 103/66 (BP Location: Right Arm)   Pulse (!) 112   Temp 98 F (36.7 C) (Oral)   Resp 20   Ht 5\' 2"  (1.575 m)   Wt 90.3 kg   LMP 02/12/2023 (Exact Date) Comment: states spotted around 03/09/23  BMI 36.40 kg/m  No results found for this or any previous visit (from the past 48 hours).   Constitutional: NAD, AAOx3  HE/ENT: extraocular movements grossly intact, moist mucous membranes CV: RRR PULM: nl respiratory effort, CTABL Abd: gravid, non-tender, non-distended, soft  Ext: Non-tender, Nonedmeatous Psych: mood appropriate, speech normal Pelvic : deferred SVE: Dilation: 1 Effacement (%): 70 Station: -2 Exam by:: A English as a second language teacher   NST: Baseline FHR: 125 beats/min Variability: moderate Accelerations: present Decelerations: absent Tocometry: occasional  Time: at least 20 minutes   Interpretation: Category I INDICATIONS: rule out uterine contractions RESULTS:  A NST procedure was performed with FHR monitoring and a normal baseline established, appropriate time of 20-40 minutes of evaluation,  and accels >2 seen w 15x15 characteristics.  Results show a REACTIVE NST.    Assessment: 23 y.o. [redacted]w[redacted]d here for antenatal surveillance during pregnancy. Patient observed  for > 2 hours without cervical change, patient aware she is possibly in prodromal labor  and aware of warning signs and when to report bact to labor and delivery.  Principle diagnosis:  The primary encounter diagnosis was Supervision of other normal pregnancy, antepartum. A diagnosis of Spotting was also pertinent to this visit.   Plan: Labor: not present.  Fetal Wellbeing: Reassuring Cat 1 tracing. Reactive NST  D/c home stable, precautions reviewed, follow-up as scheduled.   ----- Chari Manning, CNM Certified Nurse Midwife [facility]

## 2024-01-12 NOTE — OB Triage Note (Signed)
 Patient presents to L&D for contractions that started last night at 11 pm. Rating contraction pain 10/10. SVE 2/90/-2.   Felicia, CNM notified of patient arrival. Will recheck in 1 hour.   Keysean Savino C Camri Molloy

## 2024-01-12 NOTE — OB Triage Note (Signed)
 Patient is a G2P0010 at [redacted]w[redacted]d who presents to unit c/o contractions that started around 2330 last night and have increasingly gotten closer and stronger together. Reports +fetal movement, denies vaginal bleeding and leakage of fluid. External monitors applied and assessing. Initial FHT 140. Vital signs WDL.

## 2024-01-12 NOTE — OB Triage Note (Signed)
 Discharge instructions, labor precautions, and follow-up care reviewed with patient and significant other. All questions answered. Patient verbalized understanding. Discharged ambulatory off unit.

## 2024-01-13 ENCOUNTER — Inpatient Hospital Stay: Payer: Medicaid Other | Admitting: Anesthesiology

## 2024-01-13 DIAGNOSIS — O26893 Other specified pregnancy related conditions, third trimester: Secondary | ICD-10-CM | POA: Diagnosis present

## 2024-01-13 DIAGNOSIS — Z3A39 39 weeks gestation of pregnancy: Secondary | ICD-10-CM | POA: Diagnosis not present

## 2024-01-13 DIAGNOSIS — Z833 Family history of diabetes mellitus: Secondary | ICD-10-CM | POA: Diagnosis not present

## 2024-01-13 DIAGNOSIS — O99214 Obesity complicating childbirth: Secondary | ICD-10-CM | POA: Diagnosis present

## 2024-01-13 LAB — CBC
HCT: 45.4 % (ref 36.0–46.0)
Hemoglobin: 15.3 g/dL — ABNORMAL HIGH (ref 12.0–15.0)
MCH: 28.6 pg (ref 26.0–34.0)
MCHC: 33.7 g/dL (ref 30.0–36.0)
MCV: 84.9 fL (ref 80.0–100.0)
Platelets: 220 10*3/uL (ref 150–400)
RBC: 5.35 MIL/uL — ABNORMAL HIGH (ref 3.87–5.11)
RDW: 13.5 % (ref 11.5–15.5)
WBC: 12.2 10*3/uL — ABNORMAL HIGH (ref 4.0–10.5)
nRBC: 0 % (ref 0.0–0.2)

## 2024-01-13 LAB — TYPE AND SCREEN
ABO/RH(D): O POS
Antibody Screen: NEGATIVE

## 2024-01-13 LAB — RPR: RPR Ser Ql: NONREACTIVE

## 2024-01-13 MED ORDER — OXYTOCIN-SODIUM CHLORIDE 30-0.9 UT/500ML-% IV SOLN
1.0000 m[IU]/min | INTRAVENOUS | Status: DC
  Administered 2024-01-13: 2 m[IU]/min via INTRAVENOUS

## 2024-01-13 MED ORDER — LIDOCAINE-EPINEPHRINE (PF) 1.5 %-1:200000 IJ SOLN
INTRAMUSCULAR | Status: DC | PRN
  Administered 2024-01-13: 3 mL via PERINEURAL

## 2024-01-13 MED ORDER — ZOLPIDEM TARTRATE 5 MG PO TABS: 5.0000 mg | ORAL_TABLET | Freq: Every evening | ORAL | Status: DC | PRN

## 2024-01-13 MED ORDER — SOD CITRATE-CITRIC ACID 500-334 MG/5ML PO SOLN: 30.0000 mL | ORAL | Status: DC | PRN

## 2024-01-13 MED ORDER — LACTATED RINGERS IV SOLN
500.0000 mL | Freq: Once | INTRAVENOUS | Status: AC
  Administered 2024-01-13: 500 mL via INTRAVENOUS

## 2024-01-13 MED ORDER — LACTATED RINGERS IV SOLN: INTRAVENOUS | Status: DC

## 2024-01-13 MED ORDER — PHENYLEPHRINE 80 MCG/ML (10ML) SYRINGE FOR IV PUSH (FOR BLOOD PRESSURE SUPPORT): 80.0000 ug | PREFILLED_SYRINGE | INTRAVENOUS | Status: DC | PRN

## 2024-01-13 MED ORDER — LIDOCAINE HCL (PF) 1 % IJ SOLN
INTRAMUSCULAR | Status: AC
Start: 2024-01-13 — End: 2024-01-13
  Filled 2024-01-13: qty 30

## 2024-01-13 MED ORDER — AMMONIA AROMATIC IN INHA
RESPIRATORY_TRACT | Status: AC
Start: 2024-01-13 — End: 2024-01-13
  Filled 2024-01-13: qty 10

## 2024-01-13 MED ORDER — EPHEDRINE 5 MG/ML INJ
10.0000 mg | INTRAVENOUS | Status: DC | PRN
Start: 2024-01-13 — End: 2024-01-13

## 2024-01-13 MED ORDER — OXYCODONE HCL 5 MG PO TABS: 5.0000 mg | ORAL_TABLET | ORAL | Status: DC | PRN

## 2024-01-13 MED ORDER — FENTANYL-BUPIVACAINE-NACL 0.5-0.125-0.9 MG/250ML-% EP SOLN
EPIDURAL | Status: AC
Start: 2024-01-13 — End: 2024-01-13
  Filled 2024-01-13: qty 250

## 2024-01-13 MED ORDER — LIDOCAINE HCL (PF) 1 % IJ SOLN: 30.0000 mL | INTRAMUSCULAR | Status: DC | PRN

## 2024-01-13 MED ORDER — ONDANSETRON HCL 4 MG/2ML IJ SOLN: 4.0000 mg | INTRAMUSCULAR | Status: DC | PRN

## 2024-01-13 MED ORDER — DIPHENHYDRAMINE HCL 25 MG PO CAPS: 25.0000 mg | ORAL_CAPSULE | Freq: Four times a day (QID) | ORAL | Status: DC | PRN

## 2024-01-13 MED ORDER — SENNOSIDES-DOCUSATE SODIUM 8.6-50 MG PO TABS
2.0000 | ORAL_TABLET | Freq: Every day | ORAL | Status: DC
  Administered 2024-01-14: 2 via ORAL
  Filled 2024-01-13: qty 2

## 2024-01-13 MED ORDER — EPHEDRINE 5 MG/ML INJ: 10.0000 mg | INTRAVENOUS | Status: DC | PRN

## 2024-01-13 MED ORDER — IBUPROFEN 600 MG PO TABS
600.0000 mg | ORAL_TABLET | Freq: Four times a day (QID) | ORAL | Status: DC
  Administered 2024-01-13 – 2024-01-14 (×4): 600 mg via ORAL
  Filled 2024-01-13 (×5): qty 1

## 2024-01-13 MED ORDER — MISOPROSTOL 200 MCG PO TABS
ORAL_TABLET | ORAL | Status: AC
Start: 2024-01-13 — End: 2024-01-13
  Filled 2024-01-13: qty 4

## 2024-01-13 MED ORDER — WITCH HAZEL-GLYCERIN EX PADS
1.0000 | MEDICATED_PAD | CUTANEOUS | Status: DC | PRN
  Filled 2024-01-13: qty 100

## 2024-01-13 MED ORDER — LIDOCAINE HCL (PF) 1 % IJ SOLN
INTRAMUSCULAR | Status: DC | PRN
  Administered 2024-01-13: 3 mL

## 2024-01-13 MED ORDER — DIBUCAINE (PERIANAL) 1 % EX OINT
1.0000 | TOPICAL_OINTMENT | CUTANEOUS | Status: DC | PRN
  Filled 2024-01-13: qty 28

## 2024-01-13 MED ORDER — TERBUTALINE SULFATE 1 MG/ML IJ SOLN: 0.2500 mg | Freq: Once | INTRAMUSCULAR | Status: DC | PRN

## 2024-01-13 MED ORDER — OXYTOCIN BOLUS FROM INFUSION
333.0000 mL | Freq: Once | INTRAVENOUS | Status: AC
  Administered 2024-01-13: 333 mL via INTRAVENOUS

## 2024-01-13 MED ORDER — SIMETHICONE 80 MG PO CHEW: 80.0000 mg | CHEWABLE_TABLET | ORAL | Status: DC | PRN

## 2024-01-13 MED ORDER — LACTATED RINGERS IV SOLN: 500.0000 mL | INTRAVENOUS | Status: DC | PRN

## 2024-01-13 MED ORDER — OXYTOCIN-SODIUM CHLORIDE 30-0.9 UT/500ML-% IV SOLN: 2.5000 [IU]/h | INTRAVENOUS | Status: DC | PRN

## 2024-01-13 MED ORDER — DIPHENHYDRAMINE HCL 50 MG/ML IJ SOLN: 12.5000 mg | INTRAMUSCULAR | Status: DC | PRN

## 2024-01-13 MED ORDER — ONDANSETRON HCL 4 MG/2ML IJ SOLN: 4.0000 mg | Freq: Four times a day (QID) | INTRAMUSCULAR | Status: DC | PRN

## 2024-01-13 MED ORDER — ACETAMINOPHEN 325 MG PO TABS: 650.0000 mg | ORAL_TABLET | ORAL | Status: DC | PRN

## 2024-01-13 MED ORDER — FERROUS SULFATE 325 (65 FE) MG PO TABS
325.0000 mg | ORAL_TABLET | Freq: Two times a day (BID) | ORAL | Status: DC
  Administered 2024-01-13 – 2024-01-14 (×2): 325 mg via ORAL
  Filled 2024-01-13 (×2): qty 1

## 2024-01-13 MED ORDER — BUPIVACAINE HCL (PF) 0.25 % IJ SOLN
INTRAMUSCULAR | Status: DC | PRN
  Administered 2024-01-13 (×2): 5 mL via EPIDURAL

## 2024-01-13 MED ORDER — FENTANYL-BUPIVACAINE-NACL 0.5-0.125-0.9 MG/250ML-% EP SOLN: 12.0000 mL/h | EPIDURAL | Status: DC | PRN

## 2024-01-13 MED ORDER — COCONUT OIL OIL: 1.0000 | TOPICAL_OIL | Status: DC | PRN

## 2024-01-13 MED ORDER — OXYTOCIN-SODIUM CHLORIDE 30-0.9 UT/500ML-% IV SOLN
2.5000 [IU]/h | INTRAVENOUS | Status: DC
  Administered 2024-01-13: 2.5 [IU]/h via INTRAVENOUS
  Filled 2024-01-13: qty 500

## 2024-01-13 MED ORDER — ONDANSETRON HCL 4 MG PO TABS: 4.0000 mg | ORAL_TABLET | ORAL | Status: DC | PRN

## 2024-01-13 MED ORDER — FENTANYL CITRATE (PF) 100 MCG/2ML IJ SOLN: 50.0000 ug | INTRAMUSCULAR | Status: DC | PRN

## 2024-01-13 MED ORDER — PRENATAL MULTIVITAMIN CH
1.0000 | ORAL_TABLET | Freq: Every day | ORAL | Status: DC
  Administered 2024-01-13 – 2024-01-14 (×2): 1 via ORAL
  Filled 2024-01-13 (×2): qty 1

## 2024-01-13 MED ORDER — BENZOCAINE-MENTHOL 20-0.5 % EX AERO
1.0000 | INHALATION_SPRAY | CUTANEOUS | Status: DC | PRN
  Administered 2024-01-13: 1 via TOPICAL
  Filled 2024-01-13: qty 56

## 2024-01-13 MED ORDER — OXYTOCIN 10 UNIT/ML IJ SOLN
INTRAMUSCULAR | Status: AC
  Filled 2024-01-13: qty 2

## 2024-01-13 MED ORDER — FENTANYL-BUPIVACAINE-NACL 0.5-0.125-0.9 MG/250ML-% EP SOLN
EPIDURAL | Status: DC | PRN
  Administered 2024-01-13: 12 mL/h via EPIDURAL

## 2024-01-13 NOTE — Progress Notes (Signed)
 L&D Note    Subjective:  Resting in bed feeling 10/10 pain with epidural, mother at bedside  Objective:   Vitals:   01/12/24 2313 01/12/24 2315  BP: 108/73 108/73  Pulse: (!) 113 (!) 102  Resp: 20   Temp: 98.3 F (36.8 C)   TempSrc: Oral     Current Vital Signs 24h Vital Sign Ranges  T 98.3 F (36.8 C) Temp  Avg: 98.2 F (36.8 C)  Min: 98 F (36.7 C)  Max: 98.3 F (36.8 C)  BP 108/73 BP  Min: 103/66  Max: 108/73  HR (!) 102 Pulse  Avg: 109  Min: 102  Max: 113  RR 20 Resp  Avg: 20  Min: 20  Max: 20  SaO2   Room Air No data recorded      Gen: alert, cooperative, no distress FHR: Baseline: 145 bpm, Variability: moderate, Accels: Present, Decels: early Toco: regular, every 2-3 minutes SVE: Dilation: 2 Effacement (%): 90 Station: -2 Exam by:: Pat Patrick C, RN  Medications SCHEDULED MEDICATIONS   oxytocin 40 units in LR 1000 mL  333 mL Intravenous Once    MEDICATION INFUSIONS   lactated ringers     lactated ringers     oxytocin     oxytocin      PRN MEDICATIONS  acetaminophen, fentaNYL (SUBLIMAZE) injection, lactated ringers, lidocaine (PF), ondansetron, sodium citrate-citric acid, terbutaline   Assessment & Plan:  23 y.o. G2P0010 at [redacted]w[redacted]d admitted for Labor_induction_indication: Non-reactive NST and term pregnancy  -GBS: negative -IP Antibiotics: abx: none -Membranes ruptured, clear fluid -Recheck:Evaluated by digital exam. -Preeclampsia:  labs stable -Pain: level of pain (1-10, 10 severe), 10/10 -Intervention: change maternal position, anticipate vaginal delivery, place IUPC, and FSE placed -Analgesia: regional anesthesia and IVPM   Allison [name], CNM  23/27/2025 2:12 AM  [facility]

## 2024-01-13 NOTE — H&P (Signed)
 OB History & Physical   History of Present Illness:   Chief Complaint: uterine contractions  HPI:  DELAYLA HOFFMASTER is a 23 y.o. G2P0010 female at [redacted]w[redacted]d, Patient's last menstrual period was 02/12/2023., not consistent with [place] at [redacted]w[redacted]d, with Estimated Date of Delivery: 01/18/24.  She presents to L&D for category 2 FHT  Reports active fetal movement  Contractions: every 2 to 6 minutes LOF/SROM: intact Vaginal bleeding: denies  Factors complicating pregnancy:  Obesity Anxiety and depression Hx of head injury Hx of seizures  Patient Active Problem List   Diagnosis Date Noted   Normal labor and delivery 01/13/2024   Uterine contractions during pregnancy 01/12/2024   Uterine contractions 01/04/2024   Spotting 12/16/2023   Decreased fetal movement 12/04/2023   Pregnancy 10/05/2023   Pain of round ligament affecting pregnancy, antepartum 10/05/2023   Encounter for supervision of other normal pregnancy, first trimester 06/14/2023   Supervision of other normal pregnancy, antepartum 05/14/2023   SAB (spontaneous abortion) 07/01/2022   Threatened abortion 07/01/2022   Vaginal bleeding in pregnancy 07/01/2022   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   Schizoaffective disorder (HCC) 07/01/2022   High risk medication use 07/01/2022   At risk for prolonged QT interval syndrome 07/01/2022   Memory loss 07/01/2022   Encounter for supervision of normal first pregnancy in first trimester 06/05/2021   Visual hallucination 01/06/2021   History of absence seizures 01/06/2021   Panic attacks 01/06/2021   Headache disorder 04/18/2019   Seizure (HCC) 12/14/2018   History of head injury 10/25/2018   Seizure-like activity (HCC) 10/25/2018   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) 10/14/2018   Depression in pediatric patient 03/26/2018   Multiple joint pain 09/05/2015   Chest pain 01/26/2013   Orthostatic lightheadedness 01/26/2013    Prenatal Transfer Tool  Maternal Diabetes:  No Genetic Screening: Normal Maternal Ultrasounds/Referrals: Normal Fetal Ultrasounds or other Referrals:  None Maternal Substance Abuse:  No Significant Maternal Medications:  None Significant Maternal Lab Results: Group B Strep negative  Maternal Medical History:   Past Medical History:  Diagnosis Date   Asthma    Migraine    Seizures (HCC)     Past Surgical History:  Procedure Laterality Date   ADENOIDECTOMY     DILATION AND CURETTAGE OF UTERUS N/A 07/25/2021   Procedure: DILATATION AND CURETTAGE;  Surgeon: Hildred Laser, MD;  Location: [facility] ORS;  Service: Gynecology;  Laterality: N/A;   TONSILLECTOMY      Allergies  Allergen Reactions   Lactose     Other reaction(s): Other (See Comments) Intolerance   Lactose Intolerance (Gi)    Prednisone Hives    Prior to Admission medications   Medication Sig Start Date End Date Taking? Authorizing Provider  albuterol (VENTOLIN HFA) 108 (90 Base) MCG/ACT inhaler Inhale 1-2 puffs into the lungs every 6 (six) hours as needed. 03/09/23 03/08/24 Yes [provider]  famotidine (PEPCID) 20 MG tablet Take 20 mg by mouth 2 (two) times daily.   Yes [provider]  ipratropium (ATROVENT) 0.06 % nasal spray Place 2 sprays into both nostrils as needed. Patient not taking: Reported on 12/04/2023 04/05/23   [provider]     Prenatal care site:  [facility]  OB History  Gravida Para Term Preterm AB Living  2 0 0 0 1 0  SAB IAB Ectopic Multiple Live Births  1 0 0 0 0    # Outcome Date GA Lbr Len/2nd Weight Sex Type Anes PTL Lv  2 Current           1 SAB 2022             Social History: She  reports that she has never smoked. She has never been exposed to tobacco smoke. She has never used smokeless tobacco. She reports that she does not currently use alcohol. She reports that she does not use drugs.  Family History: family history includes Alcohol abuse in her father; Anxiety disorder in her  father, maternal grandfather, maternal grandmother, mother, paternal grandmother, sister, and sister; Autism in her brother; Cancer in her maternal grandfather; Cancer (age of onset: 17) in her paternal grandmother; Depression in her father, maternal grandfather, maternal grandmother, mother, paternal grandmother, sister, and sister; Diabetes in her maternal grandfather and maternal grandmother; Down syndrome in her cousin and cousin; Drug abuse in her father and mother; Healthy in her brother, brother, brother, brother, and sister; Irregular heart beat in her maternal grandfather; Ovarian cysts in her mother; Pelvic inflammatory disease in her mother; Post-traumatic stress disorder in her maternal grandfather; Schizophrenia in her sister.   Review of Systems: A full review of systems was performed and negative except as noted in the HPI.     Physical Exam:  Vital Signs: BP 108/73   Pulse (!) 102   Temp 98.3 F (36.8 C) (Oral)   Resp 20   LMP 02/12/2023 Comment: states spotted around 03/09/23  General: no acute distress.  HEENT: normocephalic, atraumatic Heart: regular rate & rhythm Lungs: normal respiratory effort Abdomen: soft, gravid, non-tender;   Pelvic:   External: Normal external female genitalia  Cervix: Dilation: 2 / Effacement (%): 90 / Station: -2    Extremities: non-tender, symmetric, no edema bilaterally.  DTRs: +2  Neurologic: Alert & oriented x 3.    No results found for this or any previous visit (from the past 24 hours).  Pertinent Results:  Prenatal Labs: Blood type/Rh O POS   Antibody screen Negative    Rubella Immune (08/07 0000)   Varicella Immune  RPR Nonreactive (02/05 0000)   HBsAg Negative (08/07 0000)  Hep C NR   HIV Non-reactive (02/05 0000)   GC neg  Chlamydia neg  Genetic screening cfDNA negative   1 hour GTT 88, 90  3 hour GTT N/a  GBS Negative/-- (02/05 0000)    FHT:  FHR: 145 bpm, variability: moderate,  accelerations:  Present,   decelerations:  Present variables Category/reactivity:  Category II UC:   regular, every 2-4 minutes   Cephalic by Leopolds and SVE   No results found.  Assessment:  KELIAH HARNED is a 23 y.o. G41P0010 female at [redacted]w[redacted]d with obesity, seizure disorder, schizoaffective disorder, and depression reports to labor and delivery for uterine contractions. During the observation period it was noted that the patient had a category 3 FHT and was admitted at term for delivery.   Plan:  1. Admit to Labor & Delivery - consents reviewed and obtained - .Dr. Lavina Hamman MD notified of admission and plan of care   2. Fetal Well being  - Fetal Tracing: category 2 - Group B Streptococcus ppx not indicated: GBS negative - Presentation: cephalic confirmed by sve   3. Routine OB: - Prenatal labs reviewed, as above - Rh positive - CBC, T&S, RPR on admit - Clear liquid diet , continuous IV fluids  4. Induction of labor  - Contractions monitored with external toco - Pelvis  adequate for trial of labor  - Plan for  induction with oxytocin and AROM  - Augmentation with oxytocin and AROM as appropriate  - Plan for  continuous fetal monitoring - Maternal pain control as desired; planning regional anesthesia and IVPM - Anticipate vaginal delivery  5. Post Partum Planning: - Infant feeding: breast and formula feeding - Contraception: Contraceptives: IUD Liletta - Tdap vaccine:  declined - Flu vaccine:  declined -RSV vaccine:declined  Chari Manning, CNM 01/13/24 2:16 AM  Chari Manning, CNM Certified Nurse Midwife [facility] [place]  [facility]

## 2024-01-13 NOTE — Anesthesia Procedure Notes (Signed)
 Epidural Patient location during procedure: OB Start time: 01/13/2024 2:54 AM End time: 01/13/2024 2:58 AM  Staffing Anesthesiologist: Louie Boston, MD Performed: anesthesiologist   Preanesthetic Checklist Completed: patient identified, IV checked, site marked, risks and benefits discussed, surgical consent, monitors and equipment checked, pre-op evaluation and timeout performed  Epidural Patient position: sitting Prep: ChloraPrep Patient monitoring: heart rate, continuous pulse ox and blood pressure Approach: midline Location: L3-L4 Injection technique: LOR saline  Needle:  Needle type: Tuohy  Needle gauge: 17 G Needle length: 9 cm and 9 Needle insertion depth: 7 cm Catheter type: closed end flexible Catheter size: 19 Gauge Catheter at skin depth: 14 cm Test dose: negative and 1.5% lidocaine with Epi 1:200 K  Assessment Sensory level: T10 Events: blood not aspirated, injection not painful, no injection resistance, no paresthesia and negative IV test  Additional Notes 1 attempt Pt. Evaluated and documentation done after procedure finished. Patient identified. Risks/Benefits/Options discussed with patient including but not limited to bleeding, infection, nerve damage, paralysis, failed block, incomplete pain control, headache, blood pressure changes, nausea, vomiting, reactions to medication both or allergic, itching and postpartum back pain. Confirmed with bedside nurse the patient's most recent platelet count. Confirmed with patient that they are not currently taking any anticoagulation, have any bleeding history or any family history of bleeding disorders. Patient expressed understanding and wished to proceed. All questions were answered. Sterile technique was used throughout the entire procedure. Please see nursing notes for vital signs. Test dose was given through epidural catheter and negative prior to continuing to dose epidural or start infusion. Warning signs of high  block given to the patient including shortness of breath, tingling/numbness in hands, complete motor block, or any concerning symptoms with instructions to call for help. Patient was given instructions on fall risk and not to get out of bed. All questions and concerns addressed with instructions to call with any issues or inadequate analgesia.    Patient tolerated the insertion well without immediate complications. Reason for block:procedure for pain

## 2024-01-13 NOTE — Discharge Summary (Shared)
 Postpartum Discharge Summary  Patient Name: Allison [name] DOB: [birth year] MRN: [id number]  Date of admission: 01/12/2024 Delivery date:01/13/2024 Delivering provider: Chari Manning Date of discharge: 01/14/2024  Primary OB: [facility] [id number] last menstrual period was 02/12/2023. EDC Estimated Date of Delivery: 01/18/24 Gestational Age at Delivery: [redacted]w[redacted]d   Admitting diagnosis: Normal labor and delivery [O80] Intrauterine pregnancy: [redacted]w[redacted]d     Secondary diagnosis:   Principal Problem:   NSVD (normal spontaneous vaginal delivery) Active Problems:   Normal labor and delivery   Discharge Diagnosis: Term Pregnancy Delivered      Hospital course: Onset of Labor With Vaginal Delivery      23 y.o. yo G2P1011 at [redacted]w[redacted]d was admitted in Latent Labor on 01/12/2024. Labor course was uncomplicated   Membrane Rupture Time/Date: 1:55 AM,01/13/2024  Delivery Method:Vaginal, Spontaneous Operative Delivery:N/A Episiotomy: None Lacerations:  1st degree;Labial Patient had a postpartum course complicated by none.  She is ambulating, tolerating a regular diet, passing flatus, and urinating well. Patient is discharged home in stable condition on 01/14/24.  Newborn Data: Birth date:01/13/2024 Birth time:7:10 AM Gender:Female Living status:Living Apgars:8 ,9  Weight:3030 g                                            Post partum procedures: none Augmentation:: AROM and Pitocin Complications: None Delivery Type: spontaneous vaginal delivery Anesthesia: epidural anesthesia Placenta: spontaneous To Pathology: No   Prenatal Labs:   Blood type/Rh O POS   Antibody screen Negative    Rubella Immune (08/07 0000)   Varicella Immune  RPR Nonreactive (02/05 0000)   HBsAg Negative (08/07 0000)  Hep C NR   HIV Non-reactive (02/05 0000)   GC neg  Chlamydia neg  Genetic screening cfDNA negative   1 hour GTT 88, 90  3 hour GTT N/a  GBS Negative/-- (02/05 0000)      Magnesium  Sulfate received: No BMZ received: No Rhophylac:was not indicated MMR: was not indicated Varivax vaccine given: was not indicated - Tdap vaccine:  declined - Flu vaccine:  declined -RSV vaccine:declined  Transfusion:No  Physical exam  Vitals:   01/13/24 1649 01/13/24 1957 01/13/24 2331 01/14/24 0756  BP: 107/70 106/63 114/77 101/63  Pulse: 62 85 87 84  Resp: 18 18 18 18   Temp: 97.7 F (36.5 C) 98.1 F (36.7 C) 98 F (36.7 C) 97.9 F (36.6 C)  TempSrc: Oral Oral Oral Oral  SpO2:  98% 100% 97%  Weight:      Height:       General: alert, cooperative, and no distress Lochia: appropriate Uterine Fundus: firm Perineum:minimal edema/repair well approximated DVT Evaluation: No evidence of DVT seen on physical exam.  Labs: Lab Results  Component Value Date   WBC 15.1 (H) 01/14/2024   HGB 9.9 (L) 01/14/2024   HCT 30.2 (L) 01/14/2024   MCV 88.6 01/14/2024   PLT 280 01/14/2024      Latest Ref Rng & Units 10/05/2023    5:06 PM  CMP  Glucose 70 - 99 mg/dL 92   BUN 6 - 20 mg/dL 6   Creatinine 6.29 - 5.28 mg/dL 4.13   Sodium 244 - 010 mmol/L 136   Potassium 3.5 - 5.1 mmol/L 3.7   Chloride 98 - 111 mmol/L 110   CO2 22 - 32 mmol/L 22   Calcium 8.9 - 10.3 mg/dL 8.4   Total Protein  6.5 - 8.1 g/dL 6.4   Total Bilirubin <1.6 mg/dL 0.4   Alkaline Phos 38 - 126 U/L 58   AST 15 - 41 U/L 21   ALT 0 - 44 U/L 18    Edinburgh Score:    01/13/2024    1:00 PM  Edinburgh Postnatal Depression Scale Screening Tool  I have been able to laugh and see the funny side of things. 0  I have looked forward with enjoyment to things. 0  I have blamed myself unnecessarily when things went wrong. 0  I have been anxious or worried for no good reason. 2  I have felt scared or panicky for no good reason. 2  Things have been getting on top of me. 0  I have been so unhappy that I have had difficulty sleeping. 0  I have felt sad or miserable. 0  I have been so unhappy that I have been crying. 0   The thought of harming myself has occurred to me. 0  Edinburgh Postnatal Depression Scale Total 4    Risk assessment for postpartum VTE and prophylactic treatment: Very high risk factors: None High risk factors: None Moderate risk factors: BMI 30-40 kg/m2  Postpartum VTE prophylaxis with LMWH not indicated  After visit meds:  Allergies as of 01/14/2024       Reactions   Lactose    Other reaction(s): Other (See Comments) Intolerance   Lactose Intolerance (gi)    Prednisone Hives        Medication List     TAKE these medications    acetaminophen 325 MG tablet Commonly known as: Tylenol Take 2 tablets (650 mg total) by mouth every 6 (six) hours as needed (for pain scale < 4).   albuterol 108 (90 Base) MCG/ACT inhaler Commonly known as: VENTOLIN HFA Inhale 1-2 puffs into the lungs every 6 (six) hours as needed.   famotidine 20 MG tablet Commonly known as: PEPCID Take 20 mg by mouth 2 (two) times daily.   ibuprofen 600 MG tablet Commonly known as: ADVIL Take 1 tablet (600 mg total) by mouth every 6 (six) hours as needed.   ipratropium 0.06 % nasal spray Commonly known as: ATROVENT Place 2 sprays into both nostrils as needed.   prenatal multivitamin Tabs tablet Take 1 tablet by mouth daily at 12 noon.       Discharge home in stable condition Infant Feeding: Bottle Infant Disposition:home with mother Discharge instruction: per After Visit Summary and Postpartum booklet. Activity: Advance as tolerated. Pelvic rest for 6 weeks.  Diet: routine diet Anticipated Birth Control:  Contraceptives: IUD Liletta Postpartum Appointment:6 weeks Additional Postpartum F/U: Postpartum Depression checkup Future Appointments:No future appointments. Follow up Visit:  Follow-up Information     Chari Manning, CNM Follow up in 2 week(s).   Specialty: Obstetrics Why: mood check Contact information: [address] [place] [address] [phone]          Chari Manning, CNM Follow up in 6 week(s).   Specialty: Obstetrics Why: postpartum Contact information: [address] [place] [address] [phone]                 Plan:  Allison [name] was discharged to home in good condition. Follow-up appointment as directed.    SignedCyril Mourning 01/14/2024 9:14 AM

## 2024-01-13 NOTE — Anesthesia Preprocedure Evaluation (Signed)
 Anesthesia Evaluation  Patient identified by MRN, date of birth, ID band Patient awake    Reviewed: Allergy & Precautions, NPO status , Patient's Chart, lab work & pertinent test results  History of Anesthesia Complications Negative for: history of anesthetic complications  Airway Mallampati: I  TM Distance: >3 FB Neck ROM: full    Dental no notable dental hx.    Pulmonary asthma    Pulmonary exam normal        Cardiovascular Exercise Tolerance: Good negative cardio ROS Normal cardiovascular exam     Neuro/Psych  Headaches PSYCHIATRIC DISORDERS Anxiety Depression  Schizophrenia     GI/Hepatic negative GI ROS,,,  Endo/Other    Renal/GU   negative genitourinary   Musculoskeletal   Abdominal   Peds  Hematology negative hematology ROS (+)   Anesthesia Other Findings Past Medical History: No date: Asthma No date: Migraine No date: Seizures (HCC)  Past Surgical History: No date: ADENOIDECTOMY 07/25/2021: DILATION AND CURETTAGE OF UTERUS; N/A     Comment:  Procedure: DILATATION AND CURETTAGE;  Surgeon: Hildred Laser, MD;  Location: [facility] ORS;  Service: Gynecology;                Laterality: N/A; No date: TONSILLECTOMY     Reproductive/Obstetrics (+) Pregnancy                             Anesthesia Physical Anesthesia Plan  ASA: 2  Anesthesia Plan: Epidural   Post-op Pain Management:    Induction:   PONV Risk Score and Plan:   Airway Management Planned: Natural Airway  Additional Equipment:   Intra-op Plan:   Post-operative Plan:   Informed Consent: I have reviewed the patients History and Physical, chart, labs and discussed the procedure including the risks, benefits and alternatives for the proposed anesthesia with the patient or authorized representative who has indicated his/her understanding and acceptance.     Dental Advisory Given  Plan Discussed  with: Anesthesiologist  Anesthesia Plan Comments: (Patient reports no bleeding problems and no anticoagulant use.   Patient consented for risks of anesthesia including but not limited to:  - adverse reactions to medications - risk of bleeding, infection and or nerve damage from epidural that could lead to paralysis - risk of headache or failed epidural - nerve damage due to positioning - that if epidural is used for C-section that there is a chance of epidural failure requiring spinal placement or conversion to GA - Damage to heart, brain, lungs, other parts of body or loss of life  Patient voiced understanding and assent.)       Anesthesia Quick Evaluation

## 2024-01-14 ENCOUNTER — Encounter: Payer: Self-pay | Admitting: Obstetrics and Gynecology

## 2024-01-14 LAB — CBC
HCT: 30.2 % — ABNORMAL LOW (ref 36.0–46.0)
Hemoglobin: 9.9 g/dL — ABNORMAL LOW (ref 12.0–15.0)
MCH: 29 pg (ref 26.0–34.0)
MCHC: 32.8 g/dL (ref 30.0–36.0)
MCV: 88.6 fL (ref 80.0–100.0)
Platelets: 280 10*3/uL (ref 150–400)
RBC: 3.41 MIL/uL — ABNORMAL LOW (ref 3.87–5.11)
RDW: 14.1 % (ref 11.5–15.5)
WBC: 15.1 10*3/uL — ABNORMAL HIGH (ref 4.0–10.5)
nRBC: 0 % (ref 0.0–0.2)

## 2024-01-14 MED ORDER — IBUPROFEN 600 MG PO TABS: 600.0000 mg | ORAL_TABLET | Freq: Four times a day (QID) | ORAL | Status: AC | PRN

## 2024-01-14 MED ORDER — PRENATAL MULTIVITAMIN CH
1.0000 | ORAL_TABLET | Freq: Every day | ORAL | Status: AC
Start: 2024-01-14 — End: ?

## 2024-01-14 MED ORDER — ACETAMINOPHEN 325 MG PO TABS: 650.0000 mg | ORAL_TABLET | Freq: Four times a day (QID) | ORAL | Status: AC | PRN

## 2024-01-14 NOTE — Anesthesia Postprocedure Evaluation (Signed)
 Anesthesia Post Note  Patient: Allison [name]  Procedure(s) Performed: AN AD HOC LABOR EPIDURAL  Patient location during evaluation: Mother Baby Anesthesia Type: Epidural Level of consciousness: awake and alert Pain management: pain level controlled Vital Signs Assessment: post-procedure vital signs reviewed and stable Respiratory status: spontaneous breathing, nonlabored ventilation and respiratory function stable Cardiovascular status: stable Postop Assessment: no headache, no backache, epidural receding and able to ambulate Anesthetic complications: no   No notable events documented.   Last Vitals:  Vitals:   01/13/24 2331 01/14/24 0756  BP: 114/77 101/63  Pulse: 87 84  Resp: 18 18  Temp: 36.7 C 36.6 C  SpO2: 100% 97%    Last Pain:  Vitals:   01/14/24 0756  TempSrc: Oral  PainSc:                  Rosanne Gutting
